# Patient Record
Sex: Female | Born: 1945 | Race: White | Hispanic: No | Marital: Married | Smoking: Never smoker
Health system: Southern US, Community
[De-identification: ages and names within clinical notes are randomized; demographics above are authoritative.]

## PROBLEM LIST (undated history)

## (undated) DIAGNOSIS — I10 Essential (primary) hypertension: Secondary | ICD-10-CM

---

## 2003-04-04 ENCOUNTER — Emergency Department (HOSPITAL_COMMUNITY): Admission: EM | Admit: 2003-04-04 | Discharge: 2003-04-04 | Payer: Self-pay | Admitting: Emergency Medicine

## 2011-01-08 ENCOUNTER — Other Ambulatory Visit (HOSPITAL_COMMUNITY): Payer: Self-pay | Admitting: Internal Medicine

## 2011-01-08 DIAGNOSIS — Z1231 Encounter for screening mammogram for malignant neoplasm of breast: Secondary | ICD-10-CM

## 2011-01-13 ENCOUNTER — Ambulatory Visit (HOSPITAL_COMMUNITY)
Admission: RE | Admit: 2011-01-13 | Discharge: 2011-01-13 | Disposition: A | Discharge status: Home / Self Care | Payer: Medicare Other | Source: Ambulatory Visit | Attending: Internal Medicine | Admitting: Internal Medicine

## 2011-01-13 DIAGNOSIS — Z1231 Encounter for screening mammogram for malignant neoplasm of breast: Secondary | ICD-10-CM | POA: Insufficient documentation

## 2011-12-16 ENCOUNTER — Other Ambulatory Visit (HOSPITAL_COMMUNITY): Payer: Self-pay | Admitting: Internal Medicine

## 2011-12-16 DIAGNOSIS — Z1231 Encounter for screening mammogram for malignant neoplasm of breast: Secondary | ICD-10-CM

## 2012-01-15 ENCOUNTER — Ambulatory Visit (HOSPITAL_COMMUNITY)
Admission: RE | Admit: 2012-01-15 | Discharge: 2012-01-15 | Disposition: A | Discharge status: Home / Self Care | Payer: Medicare Other | Source: Ambulatory Visit | Attending: Internal Medicine | Admitting: Internal Medicine

## 2012-01-15 DIAGNOSIS — Z1231 Encounter for screening mammogram for malignant neoplasm of breast: Secondary | ICD-10-CM

## 2013-01-12 ENCOUNTER — Other Ambulatory Visit (HOSPITAL_COMMUNITY): Payer: Self-pay | Admitting: Family Medicine

## 2013-01-12 DIAGNOSIS — Z1231 Encounter for screening mammogram for malignant neoplasm of breast: Secondary | ICD-10-CM

## 2013-01-20 ENCOUNTER — Ambulatory Visit (HOSPITAL_COMMUNITY)
Admission: RE | Admit: 2013-01-20 | Discharge: 2013-01-20 | Disposition: A | Discharge status: Home / Self Care | Payer: Medicare PPO | Source: Ambulatory Visit | Attending: Family Medicine | Admitting: Family Medicine

## 2013-01-20 DIAGNOSIS — Z1231 Encounter for screening mammogram for malignant neoplasm of breast: Secondary | ICD-10-CM | POA: Insufficient documentation

## 2013-01-24 ENCOUNTER — Other Ambulatory Visit: Payer: Self-pay | Admitting: Family Medicine

## 2013-01-24 DIAGNOSIS — R928 Other abnormal and inconclusive findings on diagnostic imaging of breast: Secondary | ICD-10-CM

## 2013-02-02 ENCOUNTER — Ambulatory Visit
Admission: RE | Admit: 2013-02-02 | Discharge: 2013-02-02 | Disposition: A | Discharge status: Home / Self Care | Payer: Medicare PPO | Source: Ambulatory Visit | Attending: Family Medicine | Admitting: Family Medicine

## 2013-02-02 DIAGNOSIS — R928 Other abnormal and inconclusive findings on diagnostic imaging of breast: Secondary | ICD-10-CM

## 2013-06-27 ENCOUNTER — Other Ambulatory Visit: Payer: Self-pay | Admitting: Family Medicine

## 2013-06-27 DIAGNOSIS — N6310 Unspecified lump in the right breast, unspecified quadrant: Secondary | ICD-10-CM

## 2013-08-17 ENCOUNTER — Other Ambulatory Visit: Payer: Medicare PPO

## 2013-08-18 ENCOUNTER — Ambulatory Visit
Admission: RE | Admit: 2013-08-18 | Discharge: 2013-08-18 | Disposition: A | Discharge status: Home / Self Care | Payer: Medicare PPO | Source: Ambulatory Visit | Attending: Family Medicine | Admitting: Family Medicine

## 2013-08-18 DIAGNOSIS — N6310 Unspecified lump in the right breast, unspecified quadrant: Secondary | ICD-10-CM

## 2013-12-06 ENCOUNTER — Other Ambulatory Visit: Payer: Self-pay | Admitting: Family Medicine

## 2013-12-06 ENCOUNTER — Ambulatory Visit
Admission: RE | Admit: 2013-12-06 | Discharge: 2013-12-06 | Disposition: A | Discharge status: Home / Self Care | Payer: Medicare PPO | Source: Ambulatory Visit | Attending: Family Medicine | Admitting: Family Medicine

## 2013-12-06 DIAGNOSIS — M25551 Pain in right hip: Secondary | ICD-10-CM

## 2013-12-07 ENCOUNTER — Ambulatory Visit: Payer: Medicare PPO | Attending: Family Medicine

## 2013-12-07 DIAGNOSIS — M25559 Pain in unspecified hip: Secondary | ICD-10-CM | POA: Insufficient documentation

## 2013-12-07 DIAGNOSIS — M949 Disorder of cartilage, unspecified: Secondary | ICD-10-CM | POA: Diagnosis not present

## 2013-12-07 DIAGNOSIS — I10 Essential (primary) hypertension: Secondary | ICD-10-CM | POA: Diagnosis not present

## 2013-12-07 DIAGNOSIS — IMO0001 Reserved for inherently not codable concepts without codable children: Secondary | ICD-10-CM | POA: Diagnosis present

## 2013-12-07 DIAGNOSIS — M25659 Stiffness of unspecified hip, not elsewhere classified: Secondary | ICD-10-CM | POA: Insufficient documentation

## 2013-12-07 DIAGNOSIS — M899 Disorder of bone, unspecified: Secondary | ICD-10-CM | POA: Insufficient documentation

## 2013-12-07 DIAGNOSIS — R269 Unspecified abnormalities of gait and mobility: Secondary | ICD-10-CM | POA: Insufficient documentation

## 2013-12-09 ENCOUNTER — Ambulatory Visit: Payer: Medicare PPO | Admitting: Physical Therapy

## 2013-12-09 DIAGNOSIS — IMO0001 Reserved for inherently not codable concepts without codable children: Secondary | ICD-10-CM | POA: Diagnosis not present

## 2013-12-12 ENCOUNTER — Ambulatory Visit: Payer: Medicare PPO | Admitting: Physical Therapy

## 2013-12-12 DIAGNOSIS — IMO0001 Reserved for inherently not codable concepts without codable children: Secondary | ICD-10-CM | POA: Diagnosis not present

## 2013-12-14 ENCOUNTER — Ambulatory Visit: Payer: Medicare PPO | Admitting: Physical Therapy

## 2013-12-14 DIAGNOSIS — IMO0001 Reserved for inherently not codable concepts without codable children: Secondary | ICD-10-CM | POA: Diagnosis not present

## 2013-12-29 ENCOUNTER — Ambulatory Visit: Payer: Medicare PPO | Admitting: Physical Therapy

## 2013-12-29 DIAGNOSIS — IMO0001 Reserved for inherently not codable concepts without codable children: Secondary | ICD-10-CM | POA: Diagnosis not present

## 2014-01-04 ENCOUNTER — Ambulatory Visit: Payer: Medicare PPO

## 2014-01-12 ENCOUNTER — Ambulatory Visit: Payer: Medicare PPO | Attending: Family Medicine

## 2014-01-12 DIAGNOSIS — M25559 Pain in unspecified hip: Secondary | ICD-10-CM | POA: Diagnosis not present

## 2014-01-12 DIAGNOSIS — M899 Disorder of bone, unspecified: Secondary | ICD-10-CM | POA: Diagnosis not present

## 2014-01-12 DIAGNOSIS — I10 Essential (primary) hypertension: Secondary | ICD-10-CM | POA: Diagnosis not present

## 2014-01-12 DIAGNOSIS — R269 Unspecified abnormalities of gait and mobility: Secondary | ICD-10-CM | POA: Insufficient documentation

## 2014-01-12 DIAGNOSIS — M25659 Stiffness of unspecified hip, not elsewhere classified: Secondary | ICD-10-CM | POA: Diagnosis not present

## 2014-01-12 DIAGNOSIS — IMO0001 Reserved for inherently not codable concepts without codable children: Secondary | ICD-10-CM | POA: Diagnosis present

## 2014-01-12 DIAGNOSIS — M949 Disorder of cartilage, unspecified: Secondary | ICD-10-CM | POA: Diagnosis not present

## 2014-01-25 ENCOUNTER — Ambulatory Visit: Payer: Medicare PPO | Admitting: Physical Therapy

## 2014-01-25 DIAGNOSIS — IMO0001 Reserved for inherently not codable concepts without codable children: Secondary | ICD-10-CM | POA: Diagnosis not present

## 2014-01-30 ENCOUNTER — Other Ambulatory Visit: Payer: Self-pay | Admitting: Family Medicine

## 2014-01-30 DIAGNOSIS — Z1231 Encounter for screening mammogram for malignant neoplasm of breast: Secondary | ICD-10-CM

## 2014-02-06 ENCOUNTER — Ambulatory Visit
Admission: RE | Admit: 2014-02-06 | Discharge: 2014-02-06 | Disposition: A | Discharge status: Home / Self Care | Payer: Medicare PPO | Source: Ambulatory Visit | Attending: Family Medicine | Admitting: Family Medicine

## 2014-02-06 ENCOUNTER — Encounter (INDEPENDENT_AMBULATORY_CARE_PROVIDER_SITE_OTHER): Payer: Self-pay

## 2014-02-06 DIAGNOSIS — Z1231 Encounter for screening mammogram for malignant neoplasm of breast: Secondary | ICD-10-CM

## 2014-04-06 ENCOUNTER — Emergency Department (HOSPITAL_COMMUNITY)
Admission: EM | Admit: 2014-04-06 | Discharge: 2014-04-06 | Disposition: A | Discharge status: Home / Self Care | Payer: Medicare PPO | Attending: Emergency Medicine | Admitting: Emergency Medicine

## 2014-04-06 ENCOUNTER — Emergency Department (HOSPITAL_COMMUNITY): Payer: Medicare PPO

## 2014-04-06 ENCOUNTER — Encounter (HOSPITAL_COMMUNITY): Payer: Self-pay | Admitting: *Deleted

## 2014-04-06 DIAGNOSIS — I10 Essential (primary) hypertension: Secondary | ICD-10-CM | POA: Insufficient documentation

## 2014-04-06 DIAGNOSIS — R0789 Other chest pain: Secondary | ICD-10-CM | POA: Diagnosis not present

## 2014-04-06 DIAGNOSIS — R079 Chest pain, unspecified: Secondary | ICD-10-CM | POA: Diagnosis present

## 2014-04-06 HISTORY — DX: Essential (primary) hypertension: I10

## 2014-04-06 LAB — BASIC METABOLIC PANEL
ANION GAP: 13 (ref 5–15)
BUN: 18 mg/dL (ref 6–23)
CO2: 24 meq/L (ref 19–32)
Calcium: 9.3 mg/dL (ref 8.4–10.5)
Chloride: 103 mEq/L (ref 96–112)
Creatinine, Ser: 0.8 mg/dL (ref 0.50–1.10)
GFR calc Af Amer: 85 mL/min — ABNORMAL LOW (ref >90)
GFR calc non Af Amer: 74 mL/min — ABNORMAL LOW (ref >90)
Glucose, Bld: 114 mg/dL — ABNORMAL HIGH (ref 70–99)
POTASSIUM: 3.9 meq/L (ref 3.7–5.3)
SODIUM: 140 meq/L (ref 137–147)

## 2014-04-06 LAB — I-STAT TROPONIN, ED
TROPONIN I, POC: 0 ng/mL (ref 0.00–0.08)
Troponin i, poc: 0 ng/mL (ref 0.00–0.08)

## 2014-04-06 LAB — CBC
HCT: 39.5 % (ref 36.0–46.0)
HEMOGLOBIN: 13.5 g/dL (ref 12.0–15.0)
MCH: 30.3 pg (ref 26.0–34.0)
MCHC: 34.2 g/dL (ref 30.0–36.0)
MCV: 88.6 fL (ref 78.0–100.0)
PLATELETS: 218 10*3/uL (ref 150–400)
RBC: 4.46 MIL/uL (ref 3.87–5.11)
RDW: 12.4 % (ref 11.5–15.5)
WBC: 5.4 10*3/uL (ref 4.0–10.5)

## 2014-04-06 MED ORDER — LOSARTAN POTASSIUM 50 MG PO TABS
50.0000 mg | ORAL_TABLET | Freq: Once | ORAL | Status: AC
  Administered 2014-04-06: 50 mg via ORAL
  Filled 2014-04-06: qty 1

## 2014-04-06 MED ORDER — ASPIRIN 81 MG PO CHEW
324.0000 mg | CHEWABLE_TABLET | Freq: Once | ORAL | Status: AC
  Administered 2014-04-06: 324 mg via ORAL
  Filled 2014-04-06: qty 4

## 2014-04-06 MED ORDER — OXYCODONE-ACETAMINOPHEN 5-325 MG PO TABS
0.5000 | ORAL_TABLET | Freq: Once | ORAL | Status: AC
Start: 2014-04-06 — End: 2014-04-06
  Administered 2014-04-06: 0.5 via ORAL
  Filled 2014-04-06: qty 1

## 2014-04-06 MED ORDER — OXYCODONE-ACETAMINOPHEN 5-325 MG PO TABS: ORAL_TABLET | ORAL | Status: AC

## 2014-04-06 NOTE — ED Notes (Addendum)
Patient states that she was woken up with pain to the left chest which radiated "down to my elbow and in my breast"--0645 this AM Patient stated that "It could be a muscle spasm" Patient currently rates pain 5/10 on pain scale Patient states that pain is worse with movement Patient denies SOB, N/V, diaphoresis associated with the CP Patient alert and oriented x 4 Patient in NAD Dr. Madilyn Hookees at bedside

## 2014-04-06 NOTE — Discharge Instructions (Signed)

## 2014-04-06 NOTE — ED Notes (Signed)
Discharge instructions reviewed with patient--agrees and verbalized understanding Patient informed of need to make and keep follow up appointment--agrees and verbalized understanding DC Rx x 1 reviewed with patient--agrees and verbalized understanding VS updated and stable--reviewed with patient at time of DC--agrees and verbalized understanding Patient alert and oriented x 4 and in NAD at time of discharge All questions related to this ED visit, DC instructions, DC prescriptions and follow up care answered to patient's satisfaction by this nurse 

## 2014-04-06 NOTE — ED Provider Notes (Signed)
CSN: [id number]     Arrival date & time 04/06/14  0750 History   First MD Initiated Contact with Patient 04/06/14 [phone]     Chief Complaint  Patient presents with  . Chest Pain     Patient is a 68 y.o. female presenting with chest pain. The history is provided by the patient.  Chest Pain  Ms. Mckendry presents for evaluation of chest pain. She awoke a quarter till 7 this morning with left shoulder pain that radiates into the left breast and left elbow. Pain is described as sharp in nature and constant. It is worse with movement of the arm. She is unaware of any injuries to the arm or sleeping any differently than previously. She reports that the pain spreading to her breast is more in the breast and in the chest. She denies any fevers, shortness of breath, abdominal pain, vomiting, leg edema or pain. She has no similar previous symptoms. She did not take her blood pressure medication this morning. She does not take any oral estrogens. She had a normal mammogram in September. She has a family history of valvular heart disease as well as congestive heart failure.  Past Medical History  Diagnosis Date  . Hypertension    No past surgical history on file. No family history on file. History  Substance Use Topics  . Smoking status: Never Smoker   . Smokeless tobacco: Not on file  . Alcohol Use: No   OB History    No data available     Review of Systems  Cardiovascular: Positive for chest pain.  All other systems reviewed and are negative.     Allergies  Review of patient's allergies indicates not on file.  Home Medications   Prior to Admission medications   Not on File   BP 188/102 mmHg  Pulse 94  Temp(Src) 98.2 F (36.8 C) (Oral)  Resp 20  SpO2 98% Physical Exam  Constitutional: She is oriented to person, place, and time. She appears well-developed and well-nourished.  HENT:  Head: Normocephalic and atraumatic.  Cardiovascular: Normal rate and regular rhythm.   No murmur  heard. Pulmonary/Chest: Effort normal and breath sounds normal. No respiratory distress. She exhibits no tenderness.  Abdominal: Soft. There is no tenderness. There is no rebound and no guarding.  Musculoskeletal: She exhibits no edema or tenderness.  There is mild tenderness in the left shoulder with range of motion but no appreciable palpable shoulder tenderness. 2+ radial pulses bilaterally.  Neurological: She is alert and oriented to person, place, and time.  5 out of 5 grip strength in bilateral upper extremities  Skin: Skin is warm and dry. No rash noted.  Psychiatric: She has a normal mood and affect. Her behavior is normal.  Nursing note and vitals reviewed.   ED Course  Procedures (including critical care time) Labs Review Labs Reviewed  BASIC METABOLIC PANEL - Abnormal; Notable for the following:    Glucose, Bld 114 (*)    GFR calc non Af Amer 74 (*)    GFR calc Af Amer 85 (*)    All other components within normal limits  CBC  I-STAT TROPOININ, ED  Rosezena SensorI-STAT TROPOININ, ED    Imaging Review Dg Chest Port 1 View  04/06/2014   CLINICAL DATA:  Chest pain.  EXAM: PORTABLE CHEST - 1 VIEW  COMPARISON:  None.  FINDINGS: The heart size and mediastinal contours are within normal limits. Both lungs are clear. The visualized skeletal structures are unremarkable.  IMPRESSION:  Normal exam.   Electronically Signed   By: Geanie CooleyJim  Maxwell M.D.   On: 04/06/2014 08:55     EKG Interpretation   Date/Time:  Thursday April 06 2014 07:59:52 EST Ventricular Rate:  92 PR Interval:  202 QRS Duration: 90 QT Interval:  377 QTC Calculation: 466 R Axis:   109 Text Interpretation:  Sinus rhythm Probable left atrial enlargement Right  axis deviation Baseline wander in lead(s) I III aVL Confirmed by Lincoln Brighamees, Liz  [phone]([id number]) on 04/06/2014 8:18:42 AM      MDM   Final diagnoses:  Other chest pain  Essential hypertension    Patient here for evaluation of left shoulder and breast pain. Clinical picture  is not consistent with ACS, presentation is atypical in patient's with very few risk factors. Clinical picture is not consistent with PE. Exam not consistent with rotator cuff injury, or acute septic arthritis. Discussed with patient home care with rest, gentle exercise, pain control and close PCP follow-up. Prescription written for Percocet.    Tilden FossaElizabeth Travonte Byard, MD 04/06/14 1130

## 2014-04-06 NOTE — ED Notes (Signed)
Patient states that she did not take her BP medications this morning

## 2014-12-07 DIAGNOSIS — D125 Benign neoplasm of sigmoid colon: Secondary | ICD-10-CM | POA: Diagnosis not present

## 2014-12-07 DIAGNOSIS — Z86010 Personal history of colonic polyps: Secondary | ICD-10-CM | POA: Diagnosis not present

## 2014-12-07 DIAGNOSIS — K5730 Diverticulosis of large intestine without perforation or abscess without bleeding: Secondary | ICD-10-CM | POA: Diagnosis not present

## 2014-12-07 DIAGNOSIS — K635 Polyp of colon: Secondary | ICD-10-CM | POA: Diagnosis not present

## 2014-12-07 DIAGNOSIS — K640 First degree hemorrhoids: Secondary | ICD-10-CM | POA: Diagnosis not present

## 2014-12-07 DIAGNOSIS — D122 Benign neoplasm of ascending colon: Secondary | ICD-10-CM | POA: Diagnosis not present

## 2014-12-07 DIAGNOSIS — Z09 Encounter for follow-up examination after completed treatment for conditions other than malignant neoplasm: Secondary | ICD-10-CM | POA: Diagnosis not present

## 2015-02-27 ENCOUNTER — Other Ambulatory Visit: Payer: Self-pay

## 2015-02-27 DIAGNOSIS — Z1231 Encounter for screening mammogram for malignant neoplasm of breast: Secondary | ICD-10-CM

## 2015-03-21 DIAGNOSIS — F329 Major depressive disorder, single episode, unspecified: Secondary | ICD-10-CM | POA: Diagnosis not present

## 2015-03-21 DIAGNOSIS — I10 Essential (primary) hypertension: Secondary | ICD-10-CM | POA: Diagnosis not present

## 2015-03-21 DIAGNOSIS — Z86010 Personal history of colonic polyps: Secondary | ICD-10-CM | POA: Diagnosis not present

## 2015-03-28 ENCOUNTER — Ambulatory Visit: Payer: Medicare PPO

## 2015-04-19 DIAGNOSIS — H01132 Eczematous dermatitis of right lower eyelid: Secondary | ICD-10-CM | POA: Diagnosis not present

## 2015-04-19 DIAGNOSIS — H10413 Chronic giant papillary conjunctivitis, bilateral: Secondary | ICD-10-CM | POA: Diagnosis not present

## 2015-04-19 DIAGNOSIS — H01135 Eczematous dermatitis of left lower eyelid: Secondary | ICD-10-CM | POA: Diagnosis not present

## 2015-04-19 DIAGNOSIS — H01134 Eczematous dermatitis of left upper eyelid: Secondary | ICD-10-CM | POA: Diagnosis not present

## 2015-04-19 DIAGNOSIS — H01131 Eczematous dermatitis of right upper eyelid: Secondary | ICD-10-CM | POA: Diagnosis not present

## 2015-04-19 DIAGNOSIS — Z961 Presence of intraocular lens: Secondary | ICD-10-CM | POA: Diagnosis not present

## 2015-04-25 ENCOUNTER — Ambulatory Visit
Admission: RE | Admit: 2015-04-25 | Discharge: 2015-04-25 | Disposition: A | Discharge status: Home / Self Care | Payer: Medicare PPO | Source: Ambulatory Visit

## 2015-04-25 DIAGNOSIS — Z1231 Encounter for screening mammogram for malignant neoplasm of breast: Secondary | ICD-10-CM

## 2015-11-01 IMAGING — CR DG HIP COMPLETE 2+V*R*
2 series · 2 of 2 positions shown · non-contrast
Comparison: None.

CLINICAL DATA: Severe right hip pain 1 day after walking

EXAM:
RIGHT HIP - COMPLETE 2+ VIEW

[view not recorded (1 of 2)]
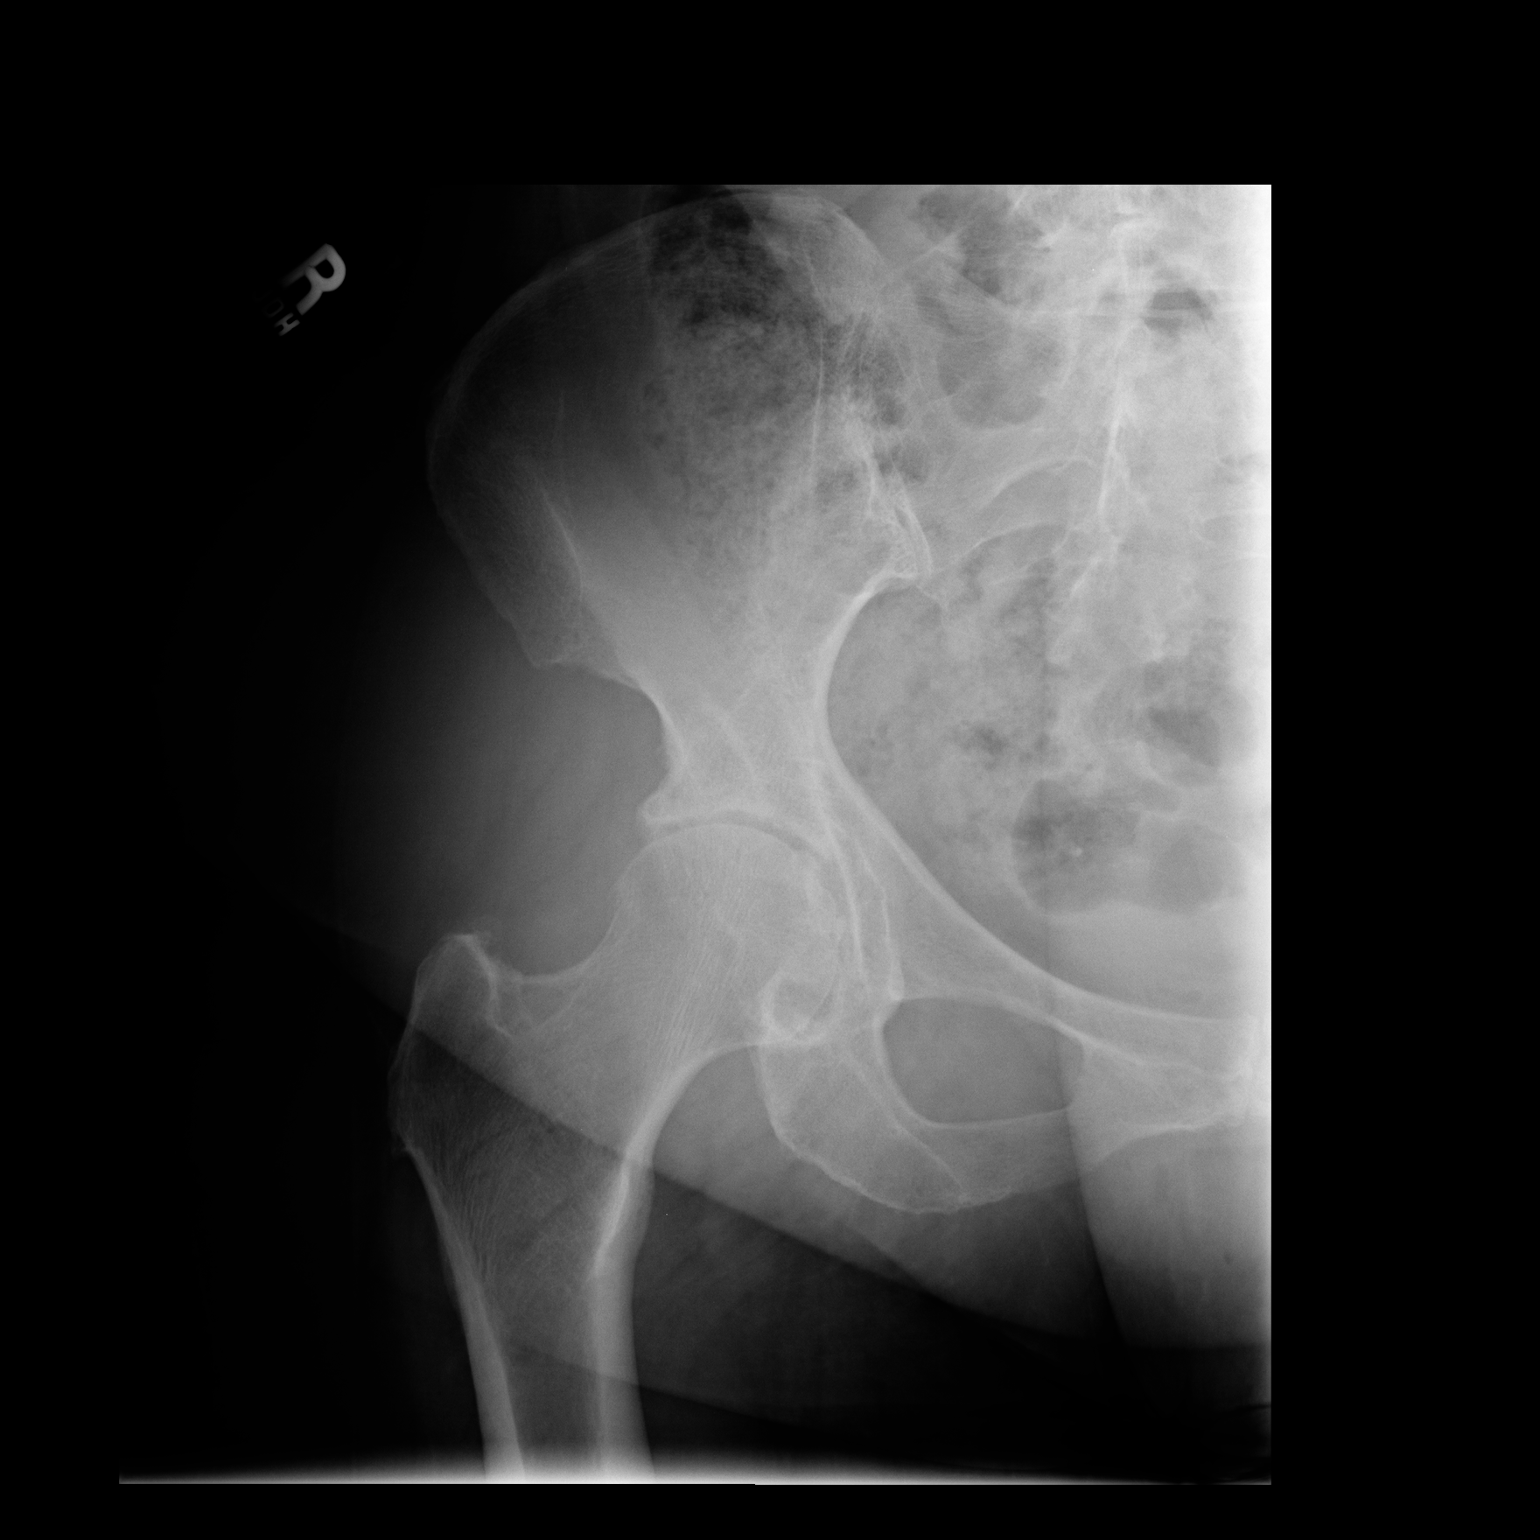

[view not recorded (2 of 2)]
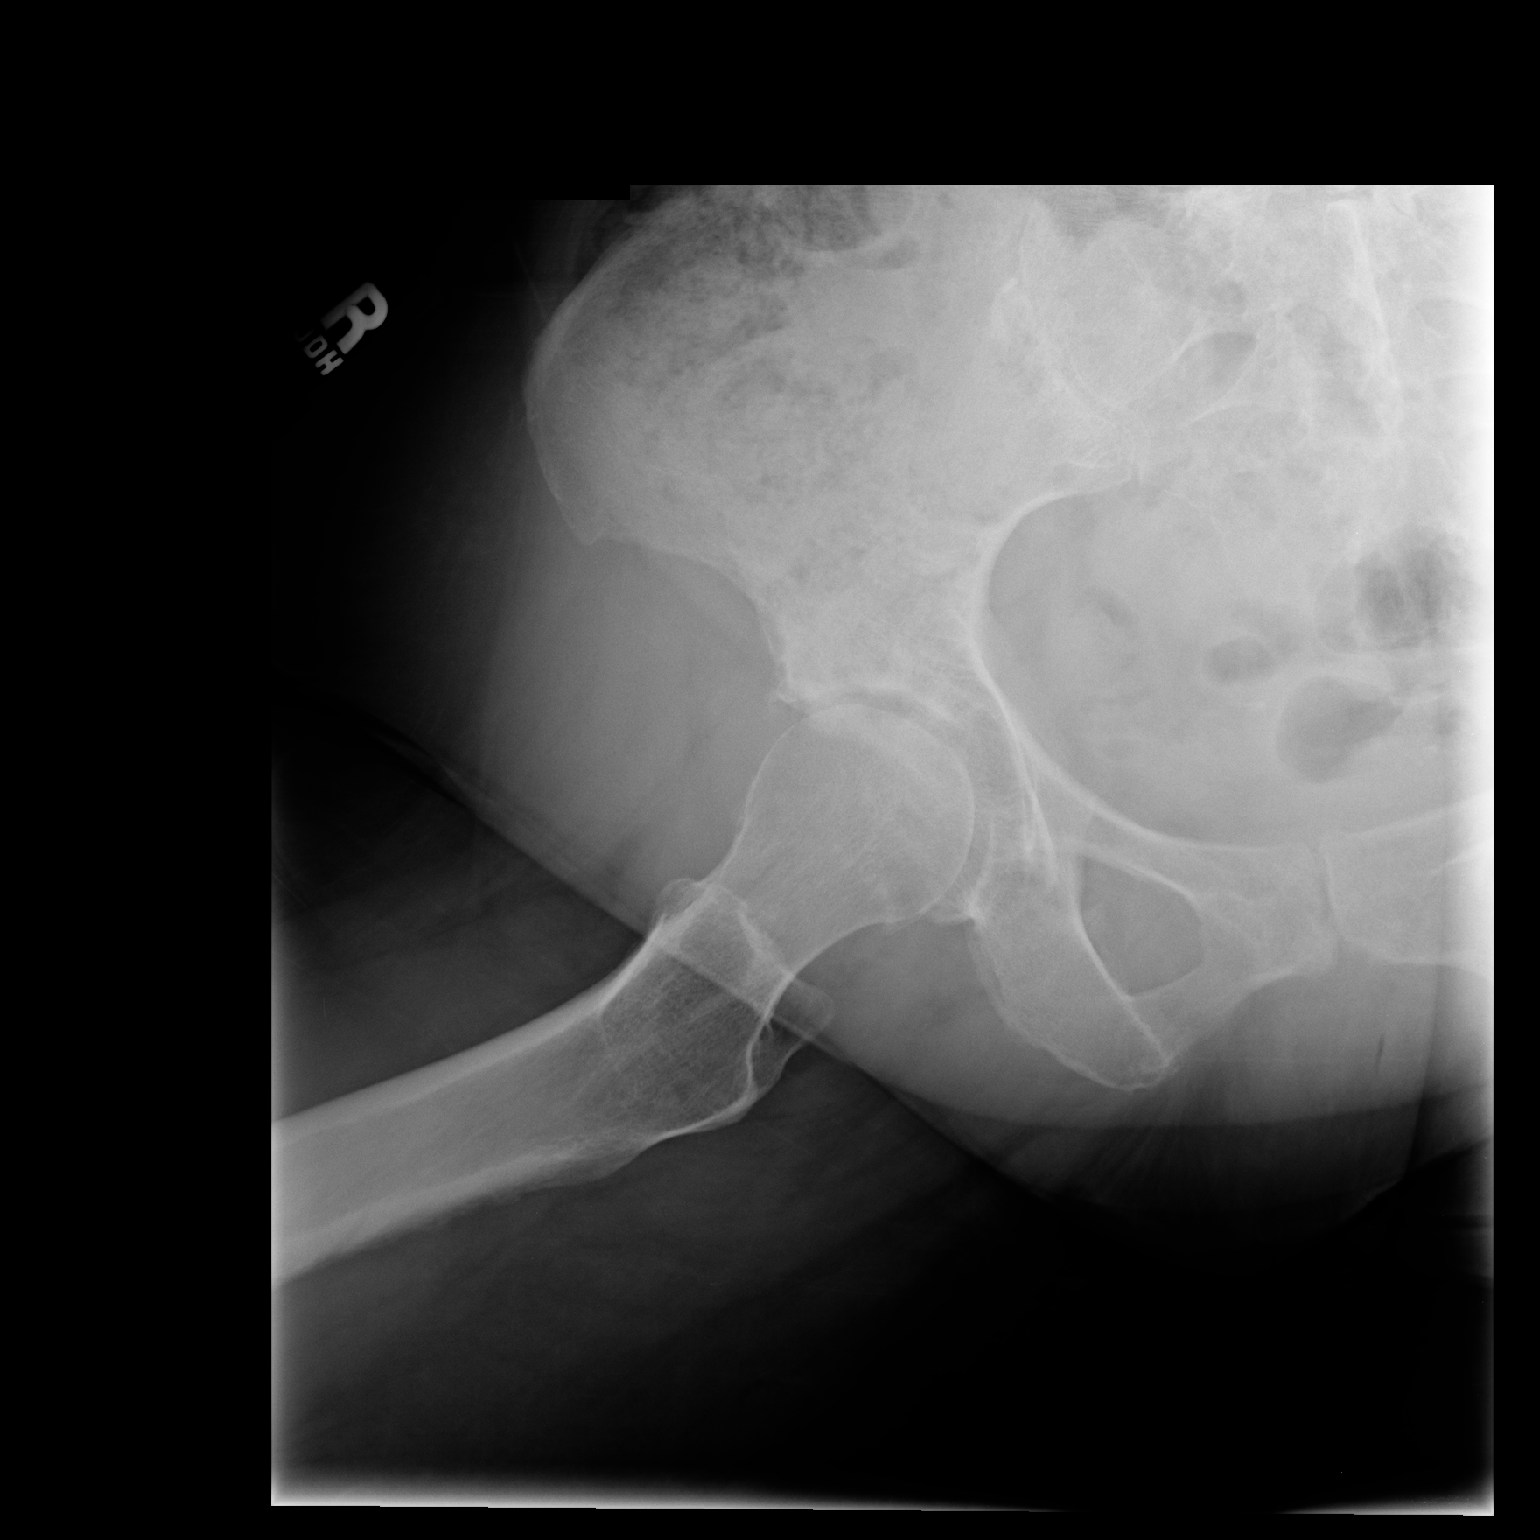

[2 of 2 positions shown; findings below may reference images not displayed]

FINDINGS: Moderate narrowing of the right hip joint with osteophyte formation.
No fracture or dislocation.
IMPRESSION: Moderately severe right hip arthritis.

## 2016-06-05 ENCOUNTER — Other Ambulatory Visit: Payer: Self-pay | Admitting: Family Medicine

## 2016-06-05 DIAGNOSIS — Z1231 Encounter for screening mammogram for malignant neoplasm of breast: Secondary | ICD-10-CM

## 2016-06-16 ENCOUNTER — Ambulatory Visit
Admission: RE | Admit: 2016-06-16 | Discharge: 2016-06-16 | Disposition: A | Discharge status: Home / Self Care | Payer: Medicare Other | Source: Ambulatory Visit | Attending: Family Medicine | Admitting: Family Medicine

## 2016-06-16 DIAGNOSIS — Z1231 Encounter for screening mammogram for malignant neoplasm of breast: Secondary | ICD-10-CM

## 2017-05-15 ENCOUNTER — Other Ambulatory Visit: Payer: Self-pay | Admitting: Family Medicine

## 2017-05-15 DIAGNOSIS — Z139 Encounter for screening, unspecified: Secondary | ICD-10-CM

## 2017-06-18 ENCOUNTER — Ambulatory Visit
Admission: RE | Admit: 2017-06-18 | Discharge: 2017-06-18 | Disposition: A | Discharge status: Home / Self Care | Payer: Medicare Other | Source: Ambulatory Visit | Attending: Family Medicine | Admitting: Family Medicine

## 2017-06-18 DIAGNOSIS — Z139 Encounter for screening, unspecified: Secondary | ICD-10-CM

## 2018-06-11 ENCOUNTER — Other Ambulatory Visit: Payer: Self-pay | Admitting: Family Medicine

## 2018-06-11 DIAGNOSIS — Z1231 Encounter for screening mammogram for malignant neoplasm of breast: Secondary | ICD-10-CM

## 2018-07-07 ENCOUNTER — Ambulatory Visit
Admission: RE | Admit: 2018-07-07 | Discharge: 2018-07-07 | Disposition: A | Discharge status: Home / Self Care | Payer: Medicare Other | Source: Ambulatory Visit | Attending: Family Medicine | Admitting: Family Medicine

## 2018-07-07 DIAGNOSIS — Z1231 Encounter for screening mammogram for malignant neoplasm of breast: Secondary | ICD-10-CM

## 2018-11-23 ENCOUNTER — Other Ambulatory Visit: Payer: Self-pay | Admitting: Family Medicine

## 2018-11-23 DIAGNOSIS — M8580 Other specified disorders of bone density and structure, unspecified site: Secondary | ICD-10-CM

## 2018-11-23 DIAGNOSIS — M859 Disorder of bone density and structure, unspecified: Secondary | ICD-10-CM

## 2019-02-25 ENCOUNTER — Other Ambulatory Visit: Payer: Self-pay | Admitting: Family Medicine

## 2019-02-25 DIAGNOSIS — Z1231 Encounter for screening mammogram for malignant neoplasm of breast: Secondary | ICD-10-CM

## 2019-07-11 ENCOUNTER — Ambulatory Visit: Payer: Medicare Other

## 2019-07-11 ENCOUNTER — Other Ambulatory Visit: Payer: Medicare Other

## 2019-09-05 ENCOUNTER — Ambulatory Visit
Admission: RE | Admit: 2019-09-05 | Discharge: 2019-09-05 | Disposition: A | Discharge status: Home / Self Care | Payer: Medicare PPO | Source: Ambulatory Visit | Attending: Family Medicine | Admitting: Family Medicine

## 2019-09-05 ENCOUNTER — Other Ambulatory Visit: Payer: Self-pay

## 2019-09-05 DIAGNOSIS — Z1231 Encounter for screening mammogram for malignant neoplasm of breast: Secondary | ICD-10-CM

## 2019-09-09 ENCOUNTER — Ambulatory Visit: Payer: Medicare Other

## 2019-09-09 ENCOUNTER — Other Ambulatory Visit: Payer: Medicare Other

## 2019-11-14 DIAGNOSIS — M5137 Other intervertebral disc degeneration, lumbosacral region: Secondary | ICD-10-CM | POA: Diagnosis not present

## 2019-11-14 DIAGNOSIS — M9903 Segmental and somatic dysfunction of lumbar region: Secondary | ICD-10-CM | POA: Diagnosis not present

## 2019-11-14 DIAGNOSIS — M5136 Other intervertebral disc degeneration, lumbar region: Secondary | ICD-10-CM | POA: Diagnosis not present

## 2019-11-15 ENCOUNTER — Other Ambulatory Visit: Payer: Self-pay

## 2019-11-15 ENCOUNTER — Ambulatory Visit
Admission: RE | Admit: 2019-11-15 | Discharge: 2019-11-15 | Disposition: A | Discharge status: Home / Self Care | Payer: Medicare PPO | Source: Ambulatory Visit | Attending: Family Medicine | Admitting: Family Medicine

## 2019-11-15 DIAGNOSIS — M8580 Other specified disorders of bone density and structure, unspecified site: Secondary | ICD-10-CM

## 2019-11-15 DIAGNOSIS — M85852 Other specified disorders of bone density and structure, left thigh: Secondary | ICD-10-CM | POA: Diagnosis not present

## 2019-11-15 DIAGNOSIS — M859 Disorder of bone density and structure, unspecified: Secondary | ICD-10-CM

## 2019-11-15 DIAGNOSIS — Z780 Asymptomatic menopausal state: Secondary | ICD-10-CM | POA: Diagnosis not present

## 2019-11-28 DIAGNOSIS — M9903 Segmental and somatic dysfunction of lumbar region: Secondary | ICD-10-CM | POA: Diagnosis not present

## 2019-11-28 DIAGNOSIS — M5137 Other intervertebral disc degeneration, lumbosacral region: Secondary | ICD-10-CM | POA: Diagnosis not present

## 2019-11-28 DIAGNOSIS — M5136 Other intervertebral disc degeneration, lumbar region: Secondary | ICD-10-CM | POA: Diagnosis not present

## 2019-12-12 DIAGNOSIS — M5137 Other intervertebral disc degeneration, lumbosacral region: Secondary | ICD-10-CM | POA: Diagnosis not present

## 2019-12-12 DIAGNOSIS — M9903 Segmental and somatic dysfunction of lumbar region: Secondary | ICD-10-CM | POA: Diagnosis not present

## 2019-12-12 DIAGNOSIS — M5136 Other intervertebral disc degeneration, lumbar region: Secondary | ICD-10-CM | POA: Diagnosis not present

## 2019-12-26 DIAGNOSIS — M5136 Other intervertebral disc degeneration, lumbar region: Secondary | ICD-10-CM | POA: Diagnosis not present

## 2019-12-26 DIAGNOSIS — M9903 Segmental and somatic dysfunction of lumbar region: Secondary | ICD-10-CM | POA: Diagnosis not present

## 2019-12-26 DIAGNOSIS — M5137 Other intervertebral disc degeneration, lumbosacral region: Secondary | ICD-10-CM | POA: Diagnosis not present

## 2020-01-02 DIAGNOSIS — L989 Disorder of the skin and subcutaneous tissue, unspecified: Secondary | ICD-10-CM | POA: Diagnosis not present

## 2020-01-02 DIAGNOSIS — Z79899 Other long term (current) drug therapy: Secondary | ICD-10-CM | POA: Diagnosis not present

## 2020-01-02 DIAGNOSIS — I10 Essential (primary) hypertension: Secondary | ICD-10-CM | POA: Diagnosis not present

## 2020-02-06 DIAGNOSIS — L570 Actinic keratosis: Secondary | ICD-10-CM | POA: Diagnosis not present

## 2020-02-06 DIAGNOSIS — L309 Dermatitis, unspecified: Secondary | ICD-10-CM | POA: Diagnosis not present

## 2020-02-06 DIAGNOSIS — L821 Other seborrheic keratosis: Secondary | ICD-10-CM | POA: Diagnosis not present

## 2020-02-10 DIAGNOSIS — Z0000 Encounter for general adult medical examination without abnormal findings: Secondary | ICD-10-CM | POA: Diagnosis not present

## 2020-02-10 DIAGNOSIS — Z23 Encounter for immunization: Secondary | ICD-10-CM | POA: Diagnosis not present

## 2020-02-10 DIAGNOSIS — Z1389 Encounter for screening for other disorder: Secondary | ICD-10-CM | POA: Diagnosis not present

## 2020-02-16 DIAGNOSIS — E785 Hyperlipidemia, unspecified: Secondary | ICD-10-CM | POA: Diagnosis not present

## 2020-02-16 DIAGNOSIS — M859 Disorder of bone density and structure, unspecified: Secondary | ICD-10-CM | POA: Diagnosis not present

## 2020-02-28 DIAGNOSIS — R21 Rash and other nonspecific skin eruption: Secondary | ICD-10-CM | POA: Diagnosis not present

## 2020-03-28 DIAGNOSIS — L309 Dermatitis, unspecified: Secondary | ICD-10-CM | POA: Diagnosis not present

## 2020-05-07 DIAGNOSIS — Z1159 Encounter for screening for other viral diseases: Secondary | ICD-10-CM | POA: Diagnosis not present

## 2020-05-09 DIAGNOSIS — K640 First degree hemorrhoids: Secondary | ICD-10-CM | POA: Diagnosis not present

## 2020-05-09 DIAGNOSIS — D122 Benign neoplasm of ascending colon: Secondary | ICD-10-CM | POA: Diagnosis not present

## 2020-05-09 DIAGNOSIS — D490 Neoplasm of unspecified behavior of digestive system: Secondary | ICD-10-CM | POA: Diagnosis not present

## 2020-05-09 DIAGNOSIS — Z86010 Personal history of colonic polyps: Secondary | ICD-10-CM | POA: Diagnosis not present

## 2020-05-09 DIAGNOSIS — K5730 Diverticulosis of large intestine without perforation or abscess without bleeding: Secondary | ICD-10-CM | POA: Diagnosis not present

## 2020-05-14 DIAGNOSIS — D122 Benign neoplasm of ascending colon: Secondary | ICD-10-CM | POA: Diagnosis not present

## 2020-05-28 DIAGNOSIS — H04123 Dry eye syndrome of bilateral lacrimal glands: Secondary | ICD-10-CM | POA: Diagnosis not present

## 2020-05-28 DIAGNOSIS — H02831 Dermatochalasis of right upper eyelid: Secondary | ICD-10-CM | POA: Diagnosis not present

## 2020-05-28 DIAGNOSIS — D3131 Benign neoplasm of right choroid: Secondary | ICD-10-CM | POA: Diagnosis not present

## 2020-05-28 DIAGNOSIS — Z961 Presence of intraocular lens: Secondary | ICD-10-CM | POA: Diagnosis not present

## 2020-05-28 DIAGNOSIS — H02834 Dermatochalasis of left upper eyelid: Secondary | ICD-10-CM | POA: Diagnosis not present

## 2020-06-27 DIAGNOSIS — K641 Second degree hemorrhoids: Secondary | ICD-10-CM | POA: Diagnosis not present

## 2020-08-01 ENCOUNTER — Other Ambulatory Visit: Payer: Self-pay | Admitting: Family Medicine

## 2020-08-01 DIAGNOSIS — Z1231 Encounter for screening mammogram for malignant neoplasm of breast: Secondary | ICD-10-CM

## 2020-08-02 DIAGNOSIS — K641 Second degree hemorrhoids: Secondary | ICD-10-CM | POA: Diagnosis not present

## 2020-08-13 DIAGNOSIS — I10 Essential (primary) hypertension: Secondary | ICD-10-CM | POA: Diagnosis not present

## 2020-08-13 DIAGNOSIS — E663 Overweight: Secondary | ICD-10-CM | POA: Diagnosis not present

## 2020-08-13 DIAGNOSIS — F3340 Major depressive disorder, recurrent, in remission, unspecified: Secondary | ICD-10-CM | POA: Diagnosis not present

## 2020-08-13 DIAGNOSIS — Z6828 Body mass index (BMI) 28.0-28.9, adult: Secondary | ICD-10-CM | POA: Diagnosis not present

## 2020-09-14 ENCOUNTER — Ambulatory Visit: Payer: Medicare PPO

## 2020-09-22 ENCOUNTER — Ambulatory Visit
Admission: RE | Admit: 2020-09-22 | Discharge: 2020-09-22 | Disposition: A | Discharge status: Home / Self Care | Payer: Medicare PPO | Source: Ambulatory Visit | Attending: Family Medicine | Admitting: Family Medicine

## 2020-09-22 ENCOUNTER — Other Ambulatory Visit: Payer: Self-pay

## 2020-09-22 DIAGNOSIS — Z1231 Encounter for screening mammogram for malignant neoplasm of breast: Secondary | ICD-10-CM

## 2021-01-16 DIAGNOSIS — E663 Overweight: Secondary | ICD-10-CM | POA: Diagnosis not present

## 2021-01-16 DIAGNOSIS — I10 Essential (primary) hypertension: Secondary | ICD-10-CM | POA: Diagnosis not present

## 2021-01-16 DIAGNOSIS — Z7722 Contact with and (suspected) exposure to environmental tobacco smoke (acute) (chronic): Secondary | ICD-10-CM | POA: Diagnosis not present

## 2021-01-16 DIAGNOSIS — M1990 Unspecified osteoarthritis, unspecified site: Secondary | ICD-10-CM | POA: Diagnosis not present

## 2021-01-16 DIAGNOSIS — F419 Anxiety disorder, unspecified: Secondary | ICD-10-CM | POA: Diagnosis not present

## 2021-01-16 DIAGNOSIS — Z6829 Body mass index (BMI) 29.0-29.9, adult: Secondary | ICD-10-CM | POA: Diagnosis not present

## 2021-01-16 DIAGNOSIS — K5900 Constipation, unspecified: Secondary | ICD-10-CM | POA: Diagnosis not present

## 2021-01-16 DIAGNOSIS — M8580 Other specified disorders of bone density and structure, unspecified site: Secondary | ICD-10-CM | POA: Diagnosis not present

## 2021-01-16 DIAGNOSIS — F324 Major depressive disorder, single episode, in partial remission: Secondary | ICD-10-CM | POA: Diagnosis not present

## 2021-02-18 DIAGNOSIS — E663 Overweight: Secondary | ICD-10-CM | POA: Diagnosis not present

## 2021-02-18 DIAGNOSIS — M859 Disorder of bone density and structure, unspecified: Secondary | ICD-10-CM | POA: Diagnosis not present

## 2021-02-18 DIAGNOSIS — F330 Major depressive disorder, recurrent, mild: Secondary | ICD-10-CM | POA: Diagnosis not present

## 2021-02-18 DIAGNOSIS — I10 Essential (primary) hypertension: Secondary | ICD-10-CM | POA: Diagnosis not present

## 2021-02-18 DIAGNOSIS — Z86010 Personal history of colonic polyps: Secondary | ICD-10-CM | POA: Diagnosis not present

## 2021-02-20 DIAGNOSIS — Z1389 Encounter for screening for other disorder: Secondary | ICD-10-CM | POA: Diagnosis not present

## 2021-02-20 DIAGNOSIS — Z0000 Encounter for general adult medical examination without abnormal findings: Secondary | ICD-10-CM | POA: Diagnosis not present

## 2021-04-16 DIAGNOSIS — Z20822 Contact with and (suspected) exposure to covid-19: Secondary | ICD-10-CM | POA: Diagnosis not present

## 2021-04-16 DIAGNOSIS — U071 COVID-19: Secondary | ICD-10-CM | POA: Diagnosis not present

## 2021-07-31 IMAGING — MG DIGITAL SCREENING BILAT W/ TOMO W/ CAD
8 series · 8 of 24 positions shown · non-contrast
Comparison: Previous exam(s).

CLINICAL DATA: Screening.

EXAM:
DIGITAL SCREENING BILATERAL MAMMOGRAM WITH TOMO AND CAD

[L MLO synth-2D]
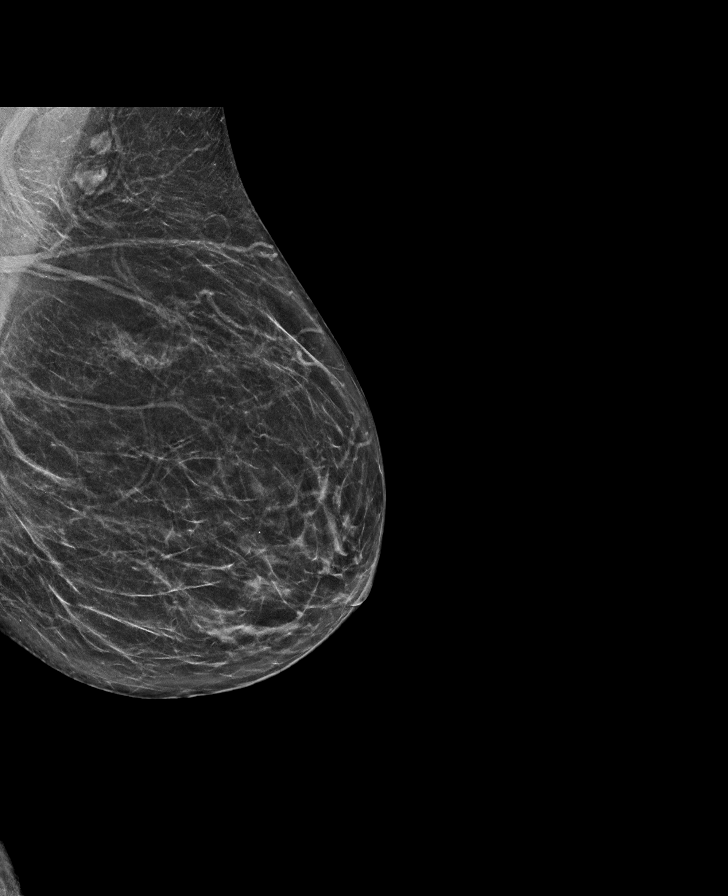

[R CC synth-2D]
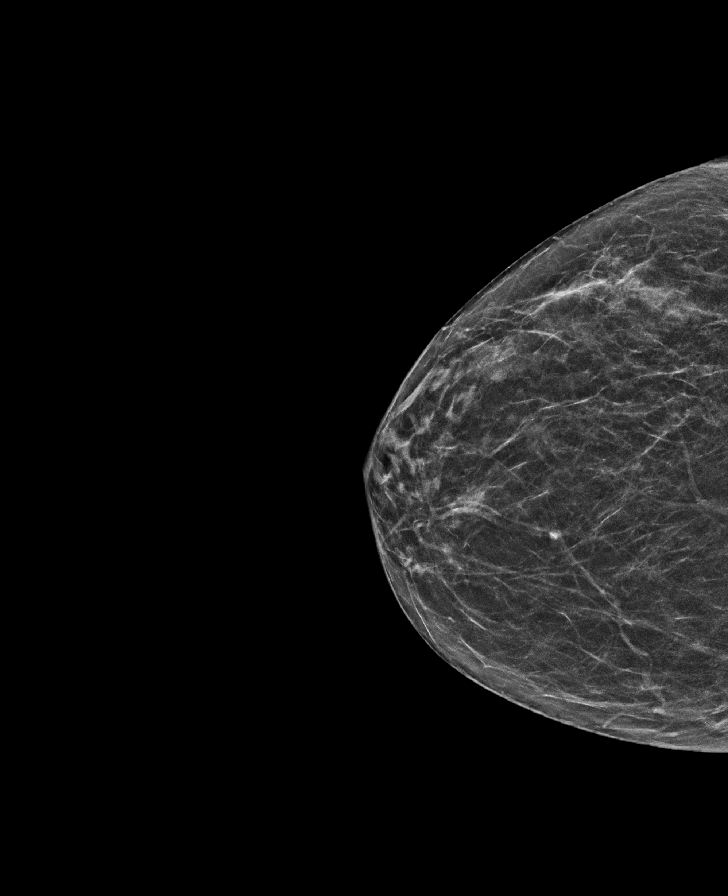

[L CC synth-2D]
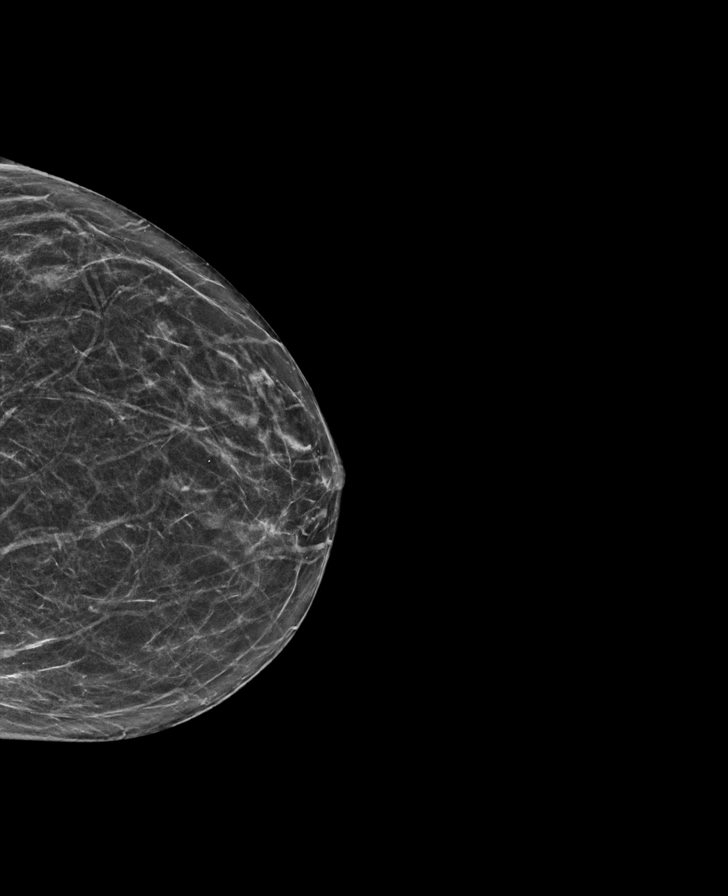

[R MLO synth-2D]
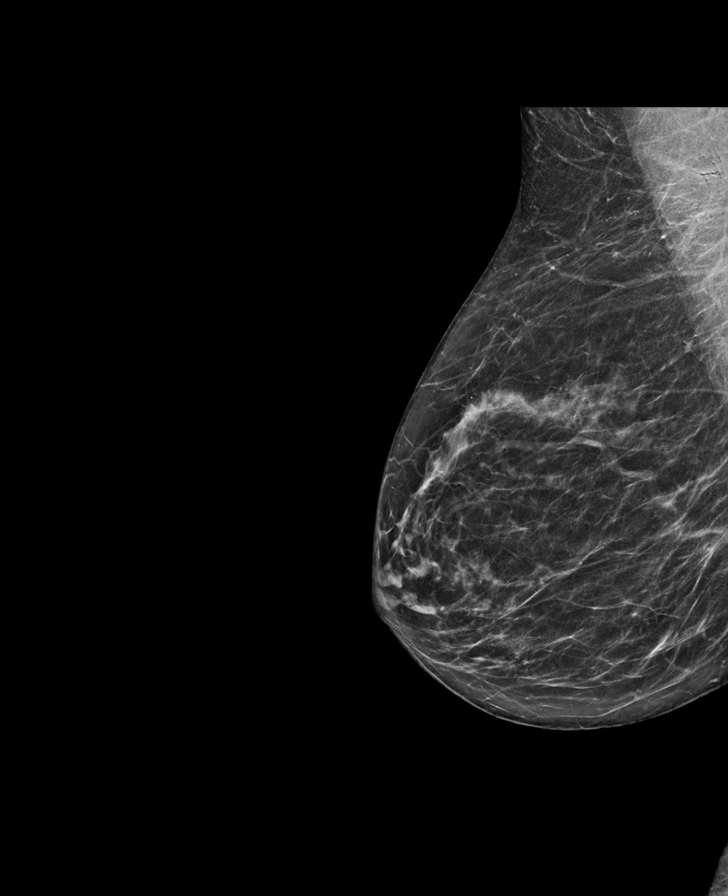

[R MLO tomo · tomo slice 32/63.0]
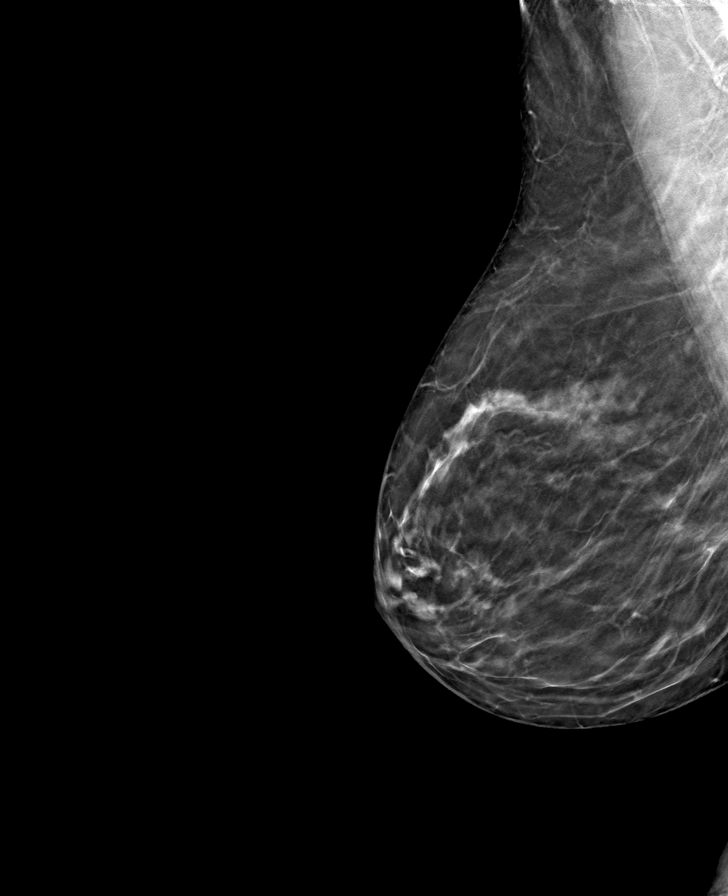

[R CC tomo · tomo slice 27/52.0]
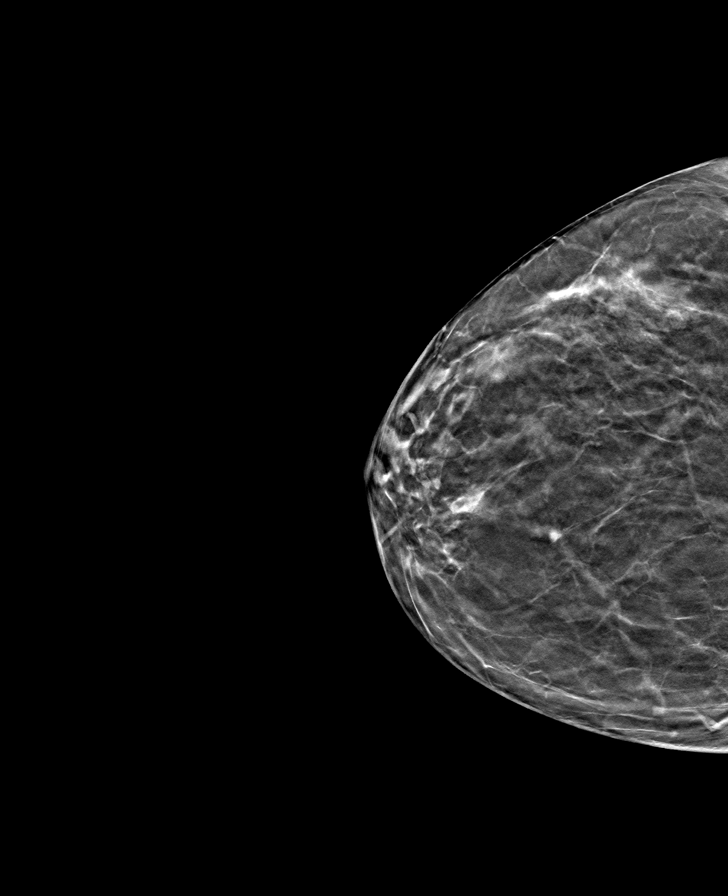

[L MLO tomo · tomo slice 31/62.0]
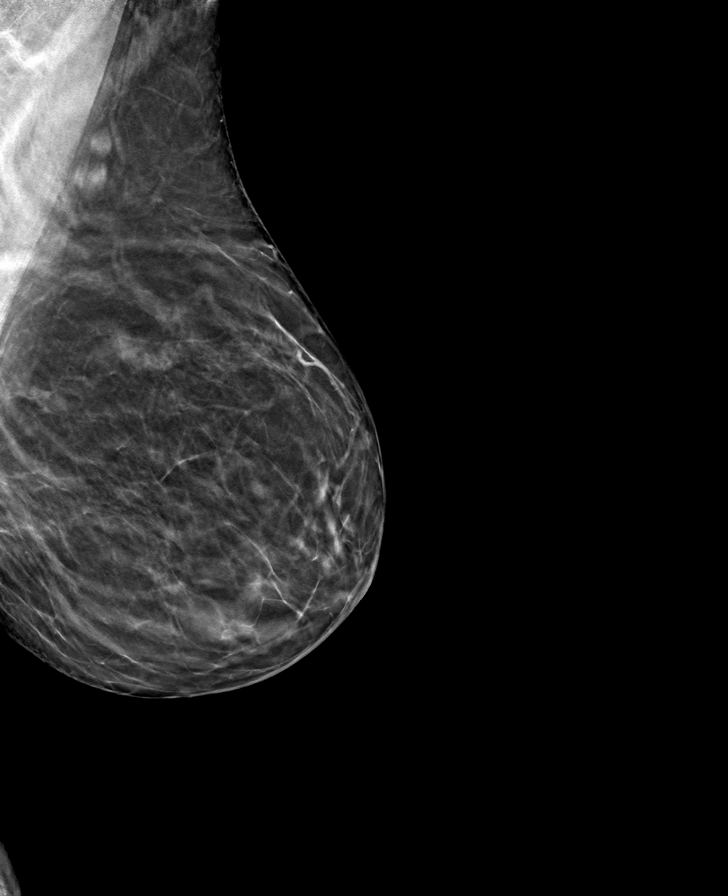

[L CC tomo · tomo slice 27/54.0]
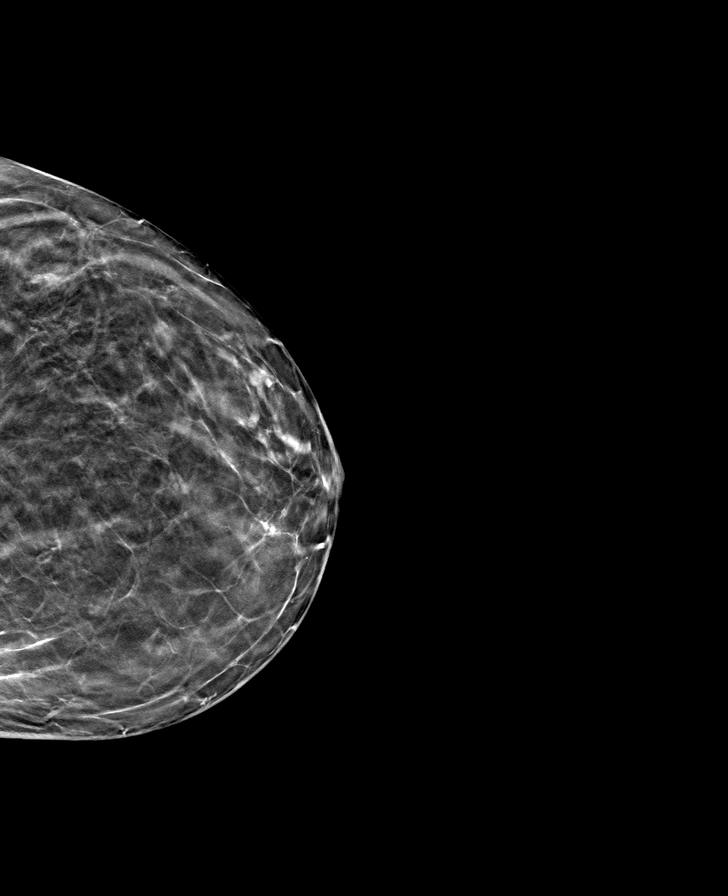

[8 of 24 positions shown; findings below may reference images not displayed]

ACR Breast Density Category b: There are scattered areas of
fibroglandular density.
FINDINGS: There are no findings suspicious for malignancy. Images were
processed with CAD.
IMPRESSION: No mammographic evidence of malignancy. A result letter of this
screening mammogram will be mailed directly to the patient.

RECOMMENDATION:
Screening mammogram in one year. (Code:[id number])

BI-RADS CATEGORY  1: Negative.

## 2021-08-06 DIAGNOSIS — M5432 Sciatica, left side: Secondary | ICD-10-CM | POA: Diagnosis not present

## 2021-08-13 ENCOUNTER — Other Ambulatory Visit: Payer: Self-pay | Admitting: Family Medicine

## 2021-08-13 DIAGNOSIS — Z1231 Encounter for screening mammogram for malignant neoplasm of breast: Secondary | ICD-10-CM

## 2021-09-23 ENCOUNTER — Ambulatory Visit
Admission: RE | Admit: 2021-09-23 | Discharge: 2021-09-23 | Disposition: A | Discharge status: Home / Self Care | Payer: Medicare PPO | Source: Ambulatory Visit | Attending: Family Medicine | Admitting: Family Medicine

## 2021-09-23 DIAGNOSIS — Z1231 Encounter for screening mammogram for malignant neoplasm of breast: Secondary | ICD-10-CM

## 2021-11-12 DIAGNOSIS — F325 Major depressive disorder, single episode, in full remission: Secondary | ICD-10-CM | POA: Diagnosis not present

## 2021-11-12 DIAGNOSIS — E669 Obesity, unspecified: Secondary | ICD-10-CM | POA: Diagnosis not present

## 2021-11-12 DIAGNOSIS — M5430 Sciatica, unspecified side: Secondary | ICD-10-CM | POA: Diagnosis not present

## 2021-11-12 DIAGNOSIS — I10 Essential (primary) hypertension: Secondary | ICD-10-CM | POA: Diagnosis not present

## 2021-11-12 DIAGNOSIS — Z8249 Family history of ischemic heart disease and other diseases of the circulatory system: Secondary | ICD-10-CM | POA: Diagnosis not present

## 2021-11-12 DIAGNOSIS — Z809 Family history of malignant neoplasm, unspecified: Secondary | ICD-10-CM | POA: Diagnosis not present

## 2021-11-12 DIAGNOSIS — Z6830 Body mass index (BMI) 30.0-30.9, adult: Secondary | ICD-10-CM | POA: Diagnosis not present

## 2021-11-12 DIAGNOSIS — Z833 Family history of diabetes mellitus: Secondary | ICD-10-CM | POA: Diagnosis not present

## 2021-11-12 DIAGNOSIS — Z882 Allergy status to sulfonamides status: Secondary | ICD-10-CM | POA: Diagnosis not present

## 2022-02-19 DIAGNOSIS — I10 Essential (primary) hypertension: Secondary | ICD-10-CM | POA: Diagnosis not present

## 2022-02-21 ENCOUNTER — Other Ambulatory Visit: Payer: Self-pay | Admitting: Family Medicine

## 2022-02-21 DIAGNOSIS — M8580 Other specified disorders of bone density and structure, unspecified site: Secondary | ICD-10-CM

## 2022-02-21 DIAGNOSIS — Z6832 Body mass index (BMI) 32.0-32.9, adult: Secondary | ICD-10-CM | POA: Diagnosis not present

## 2022-02-21 DIAGNOSIS — Z1389 Encounter for screening for other disorder: Secondary | ICD-10-CM | POA: Diagnosis not present

## 2022-02-21 DIAGNOSIS — Z23 Encounter for immunization: Secondary | ICD-10-CM | POA: Diagnosis not present

## 2022-02-21 DIAGNOSIS — Z0000 Encounter for general adult medical examination without abnormal findings: Secondary | ICD-10-CM | POA: Diagnosis not present

## 2022-02-25 DIAGNOSIS — R09A2 Foreign body sensation, throat: Secondary | ICD-10-CM | POA: Diagnosis not present

## 2022-02-25 DIAGNOSIS — E669 Obesity, unspecified: Secondary | ICD-10-CM | POA: Diagnosis not present

## 2022-02-25 DIAGNOSIS — R739 Hyperglycemia, unspecified: Secondary | ICD-10-CM | POA: Diagnosis not present

## 2022-02-25 DIAGNOSIS — F3340 Major depressive disorder, recurrent, in remission, unspecified: Secondary | ICD-10-CM | POA: Diagnosis not present

## 2022-02-25 DIAGNOSIS — I10 Essential (primary) hypertension: Secondary | ICD-10-CM | POA: Diagnosis not present

## 2022-02-25 DIAGNOSIS — R194 Change in bowel habit: Secondary | ICD-10-CM | POA: Diagnosis not present

## 2022-03-17 ENCOUNTER — Other Ambulatory Visit: Payer: Self-pay | Admitting: Family Medicine

## 2022-03-17 DIAGNOSIS — Z1231 Encounter for screening mammogram for malignant neoplasm of breast: Secondary | ICD-10-CM

## 2022-04-16 DIAGNOSIS — H02831 Dermatochalasis of right upper eyelid: Secondary | ICD-10-CM | POA: Diagnosis not present

## 2022-04-16 DIAGNOSIS — H04123 Dry eye syndrome of bilateral lacrimal glands: Secondary | ICD-10-CM | POA: Diagnosis not present

## 2022-04-16 DIAGNOSIS — H02834 Dermatochalasis of left upper eyelid: Secondary | ICD-10-CM | POA: Diagnosis not present

## 2022-04-16 DIAGNOSIS — D3131 Benign neoplasm of right choroid: Secondary | ICD-10-CM | POA: Diagnosis not present

## 2022-04-16 DIAGNOSIS — Z961 Presence of intraocular lens: Secondary | ICD-10-CM | POA: Diagnosis not present

## 2022-05-14 DIAGNOSIS — E669 Obesity, unspecified: Secondary | ICD-10-CM | POA: Diagnosis not present

## 2022-05-14 DIAGNOSIS — M8580 Other specified disorders of bone density and structure, unspecified site: Secondary | ICD-10-CM | POA: Diagnosis not present

## 2022-05-14 DIAGNOSIS — I10 Essential (primary) hypertension: Secondary | ICD-10-CM | POA: Diagnosis not present

## 2022-05-14 DIAGNOSIS — Z6831 Body mass index (BMI) 31.0-31.9, adult: Secondary | ICD-10-CM | POA: Diagnosis not present

## 2022-05-14 DIAGNOSIS — F320 Major depressive disorder, single episode, mild: Secondary | ICD-10-CM | POA: Diagnosis not present

## 2022-05-14 DIAGNOSIS — R32 Unspecified urinary incontinence: Secondary | ICD-10-CM | POA: Diagnosis not present

## 2022-05-26 DIAGNOSIS — Z6832 Body mass index (BMI) 32.0-32.9, adult: Secondary | ICD-10-CM | POA: Diagnosis not present

## 2022-05-26 DIAGNOSIS — E669 Obesity, unspecified: Secondary | ICD-10-CM | POA: Diagnosis not present

## 2022-06-04 DIAGNOSIS — Z6832 Body mass index (BMI) 32.0-32.9, adult: Secondary | ICD-10-CM | POA: Diagnosis not present

## 2022-08-22 DIAGNOSIS — D225 Melanocytic nevi of trunk: Secondary | ICD-10-CM | POA: Diagnosis not present

## 2022-08-22 DIAGNOSIS — L821 Other seborrheic keratosis: Secondary | ICD-10-CM | POA: Diagnosis not present

## 2022-08-22 DIAGNOSIS — L570 Actinic keratosis: Secondary | ICD-10-CM | POA: Diagnosis not present

## 2022-08-22 DIAGNOSIS — D1801 Hemangioma of skin and subcutaneous tissue: Secondary | ICD-10-CM | POA: Diagnosis not present

## 2022-08-22 DIAGNOSIS — L814 Other melanin hyperpigmentation: Secondary | ICD-10-CM | POA: Diagnosis not present

## 2022-09-08 DIAGNOSIS — Z6833 Body mass index (BMI) 33.0-33.9, adult: Secondary | ICD-10-CM | POA: Diagnosis not present

## 2022-09-08 DIAGNOSIS — M25512 Pain in left shoulder: Secondary | ICD-10-CM | POA: Diagnosis not present

## 2022-10-01 ENCOUNTER — Ambulatory Visit
Admission: RE | Admit: 2022-10-01 | Discharge: 2022-10-01 | Disposition: A | Discharge status: Home / Self Care | Payer: Medicare PPO | Source: Ambulatory Visit | Attending: Family Medicine | Admitting: Family Medicine

## 2022-10-01 DIAGNOSIS — M810 Age-related osteoporosis without current pathological fracture: Secondary | ICD-10-CM | POA: Diagnosis not present

## 2022-10-01 DIAGNOSIS — Z1231 Encounter for screening mammogram for malignant neoplasm of breast: Secondary | ICD-10-CM

## 2022-10-01 DIAGNOSIS — E349 Endocrine disorder, unspecified: Secondary | ICD-10-CM | POA: Diagnosis not present

## 2022-10-01 DIAGNOSIS — N951 Menopausal and female climacteric states: Secondary | ICD-10-CM | POA: Diagnosis not present

## 2022-10-01 DIAGNOSIS — M8580 Other specified disorders of bone density and structure, unspecified site: Secondary | ICD-10-CM

## 2022-10-03 ENCOUNTER — Other Ambulatory Visit: Payer: Self-pay | Admitting: Family Medicine

## 2022-10-03 DIAGNOSIS — R928 Other abnormal and inconclusive findings on diagnostic imaging of breast: Secondary | ICD-10-CM

## 2022-10-09 ENCOUNTER — Ambulatory Visit
Admission: RE | Admit: 2022-10-09 | Discharge: 2022-10-09 | Disposition: A | Discharge status: Home / Self Care | Payer: Medicare PPO | Source: Ambulatory Visit | Attending: Family Medicine | Admitting: Family Medicine

## 2022-10-09 DIAGNOSIS — N6001 Solitary cyst of right breast: Secondary | ICD-10-CM | POA: Diagnosis not present

## 2022-10-09 DIAGNOSIS — R928 Other abnormal and inconclusive findings on diagnostic imaging of breast: Secondary | ICD-10-CM

## 2022-10-09 DIAGNOSIS — R922 Inconclusive mammogram: Secondary | ICD-10-CM | POA: Diagnosis not present

## 2022-10-17 DIAGNOSIS — I10 Essential (primary) hypertension: Secondary | ICD-10-CM | POA: Diagnosis not present

## 2022-10-17 DIAGNOSIS — F3340 Major depressive disorder, recurrent, in remission, unspecified: Secondary | ICD-10-CM | POA: Diagnosis not present

## 2022-10-17 DIAGNOSIS — Z6833 Body mass index (BMI) 33.0-33.9, adult: Secondary | ICD-10-CM | POA: Diagnosis not present

## 2022-10-17 DIAGNOSIS — K6289 Other specified diseases of anus and rectum: Secondary | ICD-10-CM | POA: Diagnosis not present

## 2022-11-12 DIAGNOSIS — M545 Low back pain, unspecified: Secondary | ICD-10-CM | POA: Diagnosis not present

## 2022-11-12 DIAGNOSIS — M160 Bilateral primary osteoarthritis of hip: Secondary | ICD-10-CM | POA: Diagnosis not present

## 2023-02-17 DIAGNOSIS — H6123 Impacted cerumen, bilateral: Secondary | ICD-10-CM | POA: Diagnosis not present

## 2023-03-02 DIAGNOSIS — Z6834 Body mass index (BMI) 34.0-34.9, adult: Secondary | ICD-10-CM | POA: Diagnosis not present

## 2023-03-02 DIAGNOSIS — R7303 Prediabetes: Secondary | ICD-10-CM | POA: Diagnosis not present

## 2023-03-02 DIAGNOSIS — Z131 Encounter for screening for diabetes mellitus: Secondary | ICD-10-CM | POA: Diagnosis not present

## 2023-03-02 DIAGNOSIS — I10 Essential (primary) hypertension: Secondary | ICD-10-CM | POA: Diagnosis not present

## 2023-03-02 DIAGNOSIS — Z1331 Encounter for screening for depression: Secondary | ICD-10-CM | POA: Diagnosis not present

## 2023-03-02 DIAGNOSIS — Z0000 Encounter for general adult medical examination without abnormal findings: Secondary | ICD-10-CM | POA: Diagnosis not present

## 2023-03-02 DIAGNOSIS — Z136 Encounter for screening for cardiovascular disorders: Secondary | ICD-10-CM | POA: Diagnosis not present

## 2023-03-09 DIAGNOSIS — I10 Essential (primary) hypertension: Secondary | ICD-10-CM | POA: Diagnosis not present

## 2023-03-09 DIAGNOSIS — F3340 Major depressive disorder, recurrent, in remission, unspecified: Secondary | ICD-10-CM | POA: Diagnosis not present

## 2023-03-09 DIAGNOSIS — R7303 Prediabetes: Secondary | ICD-10-CM | POA: Diagnosis not present

## 2023-03-09 DIAGNOSIS — M859 Disorder of bone density and structure, unspecified: Secondary | ICD-10-CM | POA: Diagnosis not present

## 2023-03-11 DIAGNOSIS — M545 Low back pain, unspecified: Secondary | ICD-10-CM | POA: Diagnosis not present

## 2023-03-11 DIAGNOSIS — M160 Bilateral primary osteoarthritis of hip: Secondary | ICD-10-CM | POA: Diagnosis not present

## 2023-03-13 DIAGNOSIS — I10 Essential (primary) hypertension: Secondary | ICD-10-CM | POA: Diagnosis not present

## 2023-03-13 DIAGNOSIS — Z6834 Body mass index (BMI) 34.0-34.9, adult: Secondary | ICD-10-CM | POA: Diagnosis not present

## 2023-03-13 DIAGNOSIS — E669 Obesity, unspecified: Secondary | ICD-10-CM | POA: Diagnosis not present

## 2023-03-13 DIAGNOSIS — R7303 Prediabetes: Secondary | ICD-10-CM | POA: Diagnosis not present

## 2023-03-29 ENCOUNTER — Other Ambulatory Visit: Payer: Self-pay | Admitting: Medical Genetics

## 2023-03-29 DIAGNOSIS — Z006 Encounter for examination for normal comparison and control in clinical research program: Secondary | ICD-10-CM

## 2023-04-08 DIAGNOSIS — M160 Bilateral primary osteoarthritis of hip: Secondary | ICD-10-CM | POA: Diagnosis not present

## 2023-04-20 DIAGNOSIS — Z6834 Body mass index (BMI) 34.0-34.9, adult: Secondary | ICD-10-CM | POA: Diagnosis not present

## 2023-04-20 DIAGNOSIS — E6689 Other obesity not elsewhere classified: Secondary | ICD-10-CM | POA: Diagnosis not present

## 2023-04-20 DIAGNOSIS — I10 Essential (primary) hypertension: Secondary | ICD-10-CM | POA: Diagnosis not present

## 2023-04-20 DIAGNOSIS — R7303 Prediabetes: Secondary | ICD-10-CM | POA: Diagnosis not present

## 2023-04-20 DIAGNOSIS — Z713 Dietary counseling and surveillance: Secondary | ICD-10-CM | POA: Diagnosis not present

## 2023-05-11 DIAGNOSIS — M160 Bilateral primary osteoarthritis of hip: Secondary | ICD-10-CM | POA: Diagnosis not present

## 2023-06-29 DIAGNOSIS — R7303 Prediabetes: Secondary | ICD-10-CM | POA: Diagnosis not present

## 2023-06-29 DIAGNOSIS — E669 Obesity, unspecified: Secondary | ICD-10-CM | POA: Diagnosis not present

## 2023-06-29 DIAGNOSIS — Z713 Dietary counseling and surveillance: Secondary | ICD-10-CM | POA: Diagnosis not present

## 2023-07-17 DIAGNOSIS — M1611 Unilateral primary osteoarthritis, right hip: Secondary | ICD-10-CM | POA: Diagnosis not present

## 2023-07-17 DIAGNOSIS — M545 Low back pain, unspecified: Secondary | ICD-10-CM | POA: Diagnosis not present

## 2023-07-22 DIAGNOSIS — Z6834 Body mass index (BMI) 34.0-34.9, adult: Secondary | ICD-10-CM | POA: Diagnosis not present

## 2023-07-22 DIAGNOSIS — F330 Major depressive disorder, recurrent, mild: Secondary | ICD-10-CM | POA: Diagnosis not present

## 2023-07-22 DIAGNOSIS — E669 Obesity, unspecified: Secondary | ICD-10-CM | POA: Diagnosis not present

## 2023-07-22 DIAGNOSIS — I10 Essential (primary) hypertension: Secondary | ICD-10-CM | POA: Diagnosis not present

## 2023-07-22 DIAGNOSIS — N6321 Unspecified lump in the left breast, upper outer quadrant: Secondary | ICD-10-CM | POA: Diagnosis not present

## 2023-07-23 ENCOUNTER — Other Ambulatory Visit: Payer: Self-pay | Admitting: Family Medicine

## 2023-07-23 DIAGNOSIS — N6321 Unspecified lump in the left breast, upper outer quadrant: Secondary | ICD-10-CM

## 2023-08-03 DIAGNOSIS — R600 Localized edema: Secondary | ICD-10-CM | POA: Diagnosis not present

## 2023-08-03 DIAGNOSIS — I10 Essential (primary) hypertension: Secondary | ICD-10-CM | POA: Diagnosis not present

## 2023-08-03 DIAGNOSIS — R0609 Other forms of dyspnea: Secondary | ICD-10-CM | POA: Diagnosis not present

## 2023-08-06 ENCOUNTER — Ambulatory Visit
Admission: RE | Admit: 2023-08-06 | Discharge: 2023-08-06 | Disposition: A | Discharge status: Home / Self Care | Source: Ambulatory Visit | Attending: Family Medicine | Admitting: Family Medicine

## 2023-08-06 ENCOUNTER — Other Ambulatory Visit: Payer: Self-pay | Admitting: Family Medicine

## 2023-08-06 DIAGNOSIS — N6321 Unspecified lump in the left breast, upper outer quadrant: Secondary | ICD-10-CM

## 2023-08-07 ENCOUNTER — Ambulatory Visit (INDEPENDENT_AMBULATORY_CARE_PROVIDER_SITE_OTHER)
Admission: RE | Admit: 2023-08-07 | Discharge: 2023-08-07 | Disposition: A | Discharge status: Home / Self Care | Source: Ambulatory Visit | Attending: Vascular Surgery | Admitting: Vascular Surgery

## 2023-08-07 ENCOUNTER — Encounter (HOSPITAL_COMMUNITY)

## 2023-08-07 ENCOUNTER — Ambulatory Visit (HOSPITAL_COMMUNITY)
Admission: RE | Admit: 2023-08-07 | Discharge: 2023-08-07 | Disposition: A | Discharge status: Home / Self Care | Source: Ambulatory Visit | Attending: Family Medicine | Admitting: Family Medicine

## 2023-08-07 ENCOUNTER — Other Ambulatory Visit (HOSPITAL_COMMUNITY): Payer: Self-pay | Admitting: Family Medicine

## 2023-08-07 DIAGNOSIS — M79669 Pain in unspecified lower leg: Secondary | ICD-10-CM | POA: Diagnosis not present

## 2023-08-07 DIAGNOSIS — R7989 Other specified abnormal findings of blood chemistry: Secondary | ICD-10-CM | POA: Insufficient documentation

## 2023-08-07 DIAGNOSIS — R0609 Other forms of dyspnea: Secondary | ICD-10-CM | POA: Insufficient documentation

## 2023-08-07 DIAGNOSIS — R791 Abnormal coagulation profile: Secondary | ICD-10-CM | POA: Diagnosis not present

## 2023-08-07 MED ORDER — IOHEXOL 350 MG/ML SOLN
75.0000 mL | Freq: Once | INTRAVENOUS | Status: AC | PRN
  Administered 2023-08-07: 75 mL via INTRAVENOUS

## 2023-08-17 DIAGNOSIS — M1611 Unilateral primary osteoarthritis, right hip: Secondary | ICD-10-CM | POA: Diagnosis not present

## 2023-08-18 ENCOUNTER — Other Ambulatory Visit: Payer: Self-pay | Admitting: Family Medicine

## 2023-08-18 DIAGNOSIS — Z1231 Encounter for screening mammogram for malignant neoplasm of breast: Secondary | ICD-10-CM

## 2023-08-26 DIAGNOSIS — L814 Other melanin hyperpigmentation: Secondary | ICD-10-CM | POA: Diagnosis not present

## 2023-08-26 DIAGNOSIS — D225 Melanocytic nevi of trunk: Secondary | ICD-10-CM | POA: Diagnosis not present

## 2023-08-26 DIAGNOSIS — L821 Other seborrheic keratosis: Secondary | ICD-10-CM | POA: Diagnosis not present

## 2023-09-04 DIAGNOSIS — M1611 Unilateral primary osteoarthritis, right hip: Secondary | ICD-10-CM | POA: Diagnosis not present

## 2023-09-21 DIAGNOSIS — M1611 Unilateral primary osteoarthritis, right hip: Secondary | ICD-10-CM | POA: Diagnosis not present

## 2023-09-21 DIAGNOSIS — I10 Essential (primary) hypertension: Secondary | ICD-10-CM | POA: Diagnosis not present

## 2023-09-21 DIAGNOSIS — Z6834 Body mass index (BMI) 34.0-34.9, adult: Secondary | ICD-10-CM | POA: Diagnosis not present

## 2023-09-21 DIAGNOSIS — R0602 Shortness of breath: Secondary | ICD-10-CM | POA: Diagnosis not present

## 2023-09-21 DIAGNOSIS — M7989 Other specified soft tissue disorders: Secondary | ICD-10-CM | POA: Diagnosis not present

## 2023-10-16 ENCOUNTER — Ambulatory Visit
Admission: RE | Admit: 2023-10-16 | Discharge: 2023-10-16 | Disposition: A | Discharge status: Home / Self Care | Source: Ambulatory Visit | Attending: Family Medicine | Admitting: Family Medicine

## 2023-10-16 DIAGNOSIS — Z1231 Encounter for screening mammogram for malignant neoplasm of breast: Secondary | ICD-10-CM

## 2023-10-23 DIAGNOSIS — R519 Headache, unspecified: Secondary | ICD-10-CM | POA: Diagnosis not present

## 2023-10-28 DIAGNOSIS — M25551 Pain in right hip: Secondary | ICD-10-CM | POA: Diagnosis not present

## 2023-10-28 DIAGNOSIS — M1611 Unilateral primary osteoarthritis, right hip: Secondary | ICD-10-CM | POA: Diagnosis not present

## 2023-10-29 DIAGNOSIS — H02834 Dermatochalasis of left upper eyelid: Secondary | ICD-10-CM | POA: Diagnosis not present

## 2023-10-29 DIAGNOSIS — Z961 Presence of intraocular lens: Secondary | ICD-10-CM | POA: Diagnosis not present

## 2023-10-29 DIAGNOSIS — H43812 Vitreous degeneration, left eye: Secondary | ICD-10-CM | POA: Diagnosis not present

## 2023-10-29 DIAGNOSIS — D3131 Benign neoplasm of right choroid: Secondary | ICD-10-CM | POA: Diagnosis not present

## 2023-10-29 DIAGNOSIS — H04123 Dry eye syndrome of bilateral lacrimal glands: Secondary | ICD-10-CM | POA: Diagnosis not present

## 2023-10-29 DIAGNOSIS — H02831 Dermatochalasis of right upper eyelid: Secondary | ICD-10-CM | POA: Diagnosis not present

## 2023-11-01 DIAGNOSIS — W5503XA Scratched by cat, initial encounter: Secondary | ICD-10-CM | POA: Diagnosis not present

## 2023-11-01 DIAGNOSIS — S81832A Puncture wound without foreign body, left lower leg, initial encounter: Secondary | ICD-10-CM | POA: Diagnosis not present

## 2023-11-09 DIAGNOSIS — Z961 Presence of intraocular lens: Secondary | ICD-10-CM | POA: Diagnosis not present

## 2023-11-09 DIAGNOSIS — H43812 Vitreous degeneration, left eye: Secondary | ICD-10-CM | POA: Diagnosis not present

## 2023-11-10 DIAGNOSIS — W5503XA Scratched by cat, initial encounter: Secondary | ICD-10-CM | POA: Diagnosis not present

## 2023-11-10 DIAGNOSIS — S81812A Laceration without foreign body, left lower leg, initial encounter: Secondary | ICD-10-CM | POA: Diagnosis not present

## 2023-11-12 DIAGNOSIS — M1611 Unilateral primary osteoarthritis, right hip: Secondary | ICD-10-CM | POA: Diagnosis not present

## 2023-11-13 DIAGNOSIS — Z79891 Long term (current) use of opiate analgesic: Secondary | ICD-10-CM | POA: Diagnosis not present

## 2023-11-13 DIAGNOSIS — Z96641 Presence of right artificial hip joint: Secondary | ICD-10-CM | POA: Diagnosis not present

## 2023-11-13 DIAGNOSIS — I10 Essential (primary) hypertension: Secondary | ICD-10-CM | POA: Diagnosis not present

## 2023-11-13 DIAGNOSIS — Z471 Aftercare following joint replacement surgery: Secondary | ICD-10-CM | POA: Diagnosis not present

## 2023-11-17 DIAGNOSIS — Z96641 Presence of right artificial hip joint: Secondary | ICD-10-CM | POA: Diagnosis not present

## 2023-11-17 DIAGNOSIS — I10 Essential (primary) hypertension: Secondary | ICD-10-CM | POA: Diagnosis not present

## 2023-11-17 DIAGNOSIS — Z79891 Long term (current) use of opiate analgesic: Secondary | ICD-10-CM | POA: Diagnosis not present

## 2023-11-17 DIAGNOSIS — Z471 Aftercare following joint replacement surgery: Secondary | ICD-10-CM | POA: Diagnosis not present

## 2023-11-19 DIAGNOSIS — I10 Essential (primary) hypertension: Secondary | ICD-10-CM | POA: Diagnosis not present

## 2023-11-19 DIAGNOSIS — Z79891 Long term (current) use of opiate analgesic: Secondary | ICD-10-CM | POA: Diagnosis not present

## 2023-11-19 DIAGNOSIS — Z96641 Presence of right artificial hip joint: Secondary | ICD-10-CM | POA: Diagnosis not present

## 2023-11-19 DIAGNOSIS — Z471 Aftercare following joint replacement surgery: Secondary | ICD-10-CM | POA: Diagnosis not present

## 2023-11-20 DIAGNOSIS — Z79891 Long term (current) use of opiate analgesic: Secondary | ICD-10-CM | POA: Diagnosis not present

## 2023-11-20 DIAGNOSIS — Z96641 Presence of right artificial hip joint: Secondary | ICD-10-CM | POA: Diagnosis not present

## 2023-11-20 DIAGNOSIS — Z471 Aftercare following joint replacement surgery: Secondary | ICD-10-CM | POA: Diagnosis not present

## 2023-11-20 DIAGNOSIS — I10 Essential (primary) hypertension: Secondary | ICD-10-CM | POA: Diagnosis not present

## 2023-11-23 DIAGNOSIS — Z96641 Presence of right artificial hip joint: Secondary | ICD-10-CM | POA: Diagnosis not present

## 2023-11-23 DIAGNOSIS — Z79891 Long term (current) use of opiate analgesic: Secondary | ICD-10-CM | POA: Diagnosis not present

## 2023-11-23 DIAGNOSIS — Z6833 Body mass index (BMI) 33.0-33.9, adult: Secondary | ICD-10-CM | POA: Diagnosis not present

## 2023-11-23 DIAGNOSIS — I10 Essential (primary) hypertension: Secondary | ICD-10-CM | POA: Diagnosis not present

## 2023-11-23 DIAGNOSIS — R58 Hemorrhage, not elsewhere classified: Secondary | ICD-10-CM | POA: Diagnosis not present

## 2023-11-23 DIAGNOSIS — Z471 Aftercare following joint replacement surgery: Secondary | ICD-10-CM | POA: Diagnosis not present

## 2023-11-23 DIAGNOSIS — L989 Disorder of the skin and subcutaneous tissue, unspecified: Secondary | ICD-10-CM | POA: Diagnosis not present

## 2023-11-24 DIAGNOSIS — Z79891 Long term (current) use of opiate analgesic: Secondary | ICD-10-CM | POA: Diagnosis not present

## 2023-11-24 DIAGNOSIS — I10 Essential (primary) hypertension: Secondary | ICD-10-CM | POA: Diagnosis not present

## 2023-11-24 DIAGNOSIS — Z471 Aftercare following joint replacement surgery: Secondary | ICD-10-CM | POA: Diagnosis not present

## 2023-11-24 DIAGNOSIS — Z96641 Presence of right artificial hip joint: Secondary | ICD-10-CM | POA: Diagnosis not present

## 2023-11-25 DIAGNOSIS — M1611 Unilateral primary osteoarthritis, right hip: Secondary | ICD-10-CM | POA: Diagnosis not present

## 2023-11-26 DIAGNOSIS — M1611 Unilateral primary osteoarthritis, right hip: Secondary | ICD-10-CM | POA: Diagnosis not present

## 2023-12-03 DIAGNOSIS — M1611 Unilateral primary osteoarthritis, right hip: Secondary | ICD-10-CM | POA: Diagnosis not present

## 2023-12-08 DIAGNOSIS — M1611 Unilateral primary osteoarthritis, right hip: Secondary | ICD-10-CM | POA: Diagnosis not present

## 2023-12-10 DIAGNOSIS — M1611 Unilateral primary osteoarthritis, right hip: Secondary | ICD-10-CM | POA: Diagnosis not present

## 2023-12-15 DIAGNOSIS — M1611 Unilateral primary osteoarthritis, right hip: Secondary | ICD-10-CM | POA: Diagnosis not present

## 2023-12-17 DIAGNOSIS — M1611 Unilateral primary osteoarthritis, right hip: Secondary | ICD-10-CM | POA: Diagnosis not present

## 2023-12-21 DIAGNOSIS — M1611 Unilateral primary osteoarthritis, right hip: Secondary | ICD-10-CM | POA: Diagnosis not present

## 2023-12-22 DIAGNOSIS — D3131 Benign neoplasm of right choroid: Secondary | ICD-10-CM | POA: Diagnosis not present

## 2023-12-22 DIAGNOSIS — H43812 Vitreous degeneration, left eye: Secondary | ICD-10-CM | POA: Diagnosis not present

## 2023-12-24 DIAGNOSIS — M1611 Unilateral primary osteoarthritis, right hip: Secondary | ICD-10-CM | POA: Diagnosis not present

## 2023-12-28 DIAGNOSIS — M545 Low back pain, unspecified: Secondary | ICD-10-CM | POA: Diagnosis not present

## 2023-12-28 DIAGNOSIS — M1611 Unilateral primary osteoarthritis, right hip: Secondary | ICD-10-CM | POA: Diagnosis not present

## 2024-01-05 DIAGNOSIS — M1611 Unilateral primary osteoarthritis, right hip: Secondary | ICD-10-CM | POA: Diagnosis not present

## 2024-01-18 DIAGNOSIS — R7303 Prediabetes: Secondary | ICD-10-CM | POA: Diagnosis not present

## 2024-01-18 DIAGNOSIS — I10 Essential (primary) hypertension: Secondary | ICD-10-CM | POA: Diagnosis not present

## 2024-01-18 DIAGNOSIS — E7800 Pure hypercholesterolemia, unspecified: Secondary | ICD-10-CM | POA: Diagnosis not present

## 2024-01-21 DIAGNOSIS — F3340 Major depressive disorder, recurrent, in remission, unspecified: Secondary | ICD-10-CM | POA: Diagnosis not present

## 2024-01-21 DIAGNOSIS — Z23 Encounter for immunization: Secondary | ICD-10-CM | POA: Diagnosis not present

## 2024-01-21 DIAGNOSIS — Z6832 Body mass index (BMI) 32.0-32.9, adult: Secondary | ICD-10-CM | POA: Diagnosis not present

## 2024-01-21 DIAGNOSIS — M4726 Other spondylosis with radiculopathy, lumbar region: Secondary | ICD-10-CM | POA: Diagnosis not present

## 2024-01-21 DIAGNOSIS — I2510 Atherosclerotic heart disease of native coronary artery without angina pectoris: Secondary | ICD-10-CM | POA: Diagnosis not present

## 2024-01-21 DIAGNOSIS — E669 Obesity, unspecified: Secondary | ICD-10-CM | POA: Diagnosis not present

## 2024-01-21 DIAGNOSIS — I10 Essential (primary) hypertension: Secondary | ICD-10-CM | POA: Diagnosis not present

## 2024-01-21 DIAGNOSIS — R7303 Prediabetes: Secondary | ICD-10-CM | POA: Diagnosis not present

## 2024-01-27 DIAGNOSIS — M545 Low back pain, unspecified: Secondary | ICD-10-CM | POA: Diagnosis not present

## 2024-02-02 DIAGNOSIS — M545 Low back pain, unspecified: Secondary | ICD-10-CM | POA: Diagnosis not present

## 2024-02-05 DIAGNOSIS — M545 Low back pain, unspecified: Secondary | ICD-10-CM | POA: Diagnosis not present

## 2024-02-16 DIAGNOSIS — M5416 Radiculopathy, lumbar region: Secondary | ICD-10-CM | POA: Diagnosis not present

## 2024-02-18 DIAGNOSIS — M5416 Radiculopathy, lumbar region: Secondary | ICD-10-CM | POA: Diagnosis not present

## 2024-02-19 DIAGNOSIS — Z96641 Presence of right artificial hip joint: Secondary | ICD-10-CM | POA: Diagnosis not present

## 2024-02-22 DIAGNOSIS — M5416 Radiculopathy, lumbar region: Secondary | ICD-10-CM | POA: Diagnosis not present

## 2024-02-25 DIAGNOSIS — M5416 Radiculopathy, lumbar region: Secondary | ICD-10-CM | POA: Diagnosis not present

## 2024-03-01 DIAGNOSIS — M5416 Radiculopathy, lumbar region: Secondary | ICD-10-CM | POA: Diagnosis not present

## 2024-03-04 DIAGNOSIS — I129 Hypertensive chronic kidney disease with stage 1 through stage 4 chronic kidney disease, or unspecified chronic kidney disease: Secondary | ICD-10-CM | POA: Diagnosis not present

## 2024-03-04 DIAGNOSIS — E669 Obesity, unspecified: Secondary | ICD-10-CM | POA: Diagnosis not present

## 2024-03-04 DIAGNOSIS — Z818 Family history of other mental and behavioral disorders: Secondary | ICD-10-CM | POA: Diagnosis not present

## 2024-03-04 DIAGNOSIS — R32 Unspecified urinary incontinence: Secondary | ICD-10-CM | POA: Diagnosis not present

## 2024-03-04 DIAGNOSIS — G8929 Other chronic pain: Secondary | ICD-10-CM | POA: Diagnosis not present

## 2024-03-04 DIAGNOSIS — Z833 Family history of diabetes mellitus: Secondary | ICD-10-CM | POA: Diagnosis not present

## 2024-03-04 DIAGNOSIS — N1831 Chronic kidney disease, stage 3a: Secondary | ICD-10-CM | POA: Diagnosis not present

## 2024-03-04 DIAGNOSIS — Z8249 Family history of ischemic heart disease and other diseases of the circulatory system: Secondary | ICD-10-CM | POA: Diagnosis not present

## 2024-03-04 DIAGNOSIS — Z809 Family history of malignant neoplasm, unspecified: Secondary | ICD-10-CM | POA: Diagnosis not present

## 2024-03-07 ENCOUNTER — Other Ambulatory Visit: Payer: Self-pay | Admitting: Medical Genetics

## 2024-03-07 DIAGNOSIS — Z006 Encounter for examination for normal comparison and control in clinical research program: Secondary | ICD-10-CM

## 2024-03-08 DIAGNOSIS — M5416 Radiculopathy, lumbar region: Secondary | ICD-10-CM | POA: Diagnosis not present

## 2024-03-10 DIAGNOSIS — Z0000 Encounter for general adult medical examination without abnormal findings: Secondary | ICD-10-CM | POA: Diagnosis not present

## 2024-03-10 DIAGNOSIS — Z1331 Encounter for screening for depression: Secondary | ICD-10-CM | POA: Diagnosis not present

## 2024-03-11 DIAGNOSIS — M5416 Radiculopathy, lumbar region: Secondary | ICD-10-CM | POA: Diagnosis not present

## 2024-03-14 DIAGNOSIS — M5416 Radiculopathy, lumbar region: Secondary | ICD-10-CM | POA: Diagnosis not present

## 2024-03-15 DIAGNOSIS — M5416 Radiculopathy, lumbar region: Secondary | ICD-10-CM | POA: Diagnosis not present

## 2024-03-21 DIAGNOSIS — M5416 Radiculopathy, lumbar region: Secondary | ICD-10-CM | POA: Diagnosis not present

## 2024-03-22 DIAGNOSIS — M5416 Radiculopathy, lumbar region: Secondary | ICD-10-CM | POA: Diagnosis not present

## 2024-03-28 DIAGNOSIS — M79662 Pain in left lower leg: Secondary | ICD-10-CM | POA: Diagnosis not present

## 2024-03-28 DIAGNOSIS — Z96641 Presence of right artificial hip joint: Secondary | ICD-10-CM | POA: Diagnosis not present

## 2024-03-28 LAB — GENECONNECT MOLECULAR SCREEN: Genetic Analysis Overall Interpretation: NEGATIVE

## 2024-03-29 DIAGNOSIS — M5416 Radiculopathy, lumbar region: Secondary | ICD-10-CM | POA: Diagnosis not present

## 2024-04-04 DIAGNOSIS — M5416 Radiculopathy, lumbar region: Secondary | ICD-10-CM | POA: Diagnosis not present

## 2024-04-08 DIAGNOSIS — M5416 Radiculopathy, lumbar region: Secondary | ICD-10-CM | POA: Diagnosis not present

## 2024-04-12 DIAGNOSIS — M5416 Radiculopathy, lumbar region: Secondary | ICD-10-CM | POA: Diagnosis not present
